# Patient Record
Sex: Female | Born: 1965 | Race: White | Hispanic: No | Smoking: Current every day smoker
Health system: Southern US, Community
[De-identification: ages and names within clinical notes are randomized; demographics above are authoritative.]

## PROBLEM LIST (undated history)

## (undated) DIAGNOSIS — G8929 Other chronic pain: Secondary | ICD-10-CM

## (undated) DIAGNOSIS — M549 Dorsalgia, unspecified: Secondary | ICD-10-CM

## (undated) HISTORY — PX: TONSILLECTOMY: SUR1361

## (undated) HISTORY — PX: CHOLECYSTECTOMY: SHX55

## (undated) HISTORY — PX: BACK SURGERY: SHX140

---

## 2001-09-11 ENCOUNTER — Emergency Department (HOSPITAL_COMMUNITY): Admission: EM | Admit: 2001-09-11 | Discharge: 2001-09-11 | Payer: Self-pay | Admitting: Emergency Medicine

## 2001-10-02 ENCOUNTER — Emergency Department (HOSPITAL_COMMUNITY): Admission: EM | Admit: 2001-10-02 | Discharge: 2001-10-02 | Payer: Self-pay | Admitting: *Deleted

## 2001-10-18 ENCOUNTER — Emergency Department (HOSPITAL_COMMUNITY): Admission: EM | Admit: 2001-10-18 | Discharge: 2001-10-18 | Payer: Self-pay | Admitting: Emergency Medicine

## 2001-10-18 ENCOUNTER — Encounter: Payer: Self-pay | Admitting: *Deleted

## 2001-11-15 ENCOUNTER — Emergency Department (HOSPITAL_COMMUNITY): Admission: EM | Admit: 2001-11-15 | Discharge: 2001-11-15 | Payer: Self-pay | Admitting: *Deleted

## 2001-12-24 ENCOUNTER — Emergency Department (HOSPITAL_COMMUNITY): Admission: EM | Admit: 2001-12-24 | Discharge: 2001-12-24 | Payer: Self-pay | Admitting: Emergency Medicine

## 2001-12-25 ENCOUNTER — Ambulatory Visit (HOSPITAL_COMMUNITY): Admission: RE | Admit: 2001-12-25 | Discharge: 2001-12-25 | Payer: Self-pay | Admitting: Neurosurgery

## 2002-01-10 ENCOUNTER — Ambulatory Visit (HOSPITAL_COMMUNITY): Admission: RE | Admit: 2002-01-10 | Discharge: 2002-01-10 | Payer: Self-pay | Admitting: Neurosurgery

## 2002-01-29 ENCOUNTER — Inpatient Hospital Stay (HOSPITAL_COMMUNITY): Admission: RE | Admit: 2002-01-29 | Discharge: 2002-01-29 | Payer: Self-pay | Admitting: Neurosurgery

## 2002-01-29 ENCOUNTER — Encounter: Payer: Self-pay | Admitting: Neurosurgery

## 2002-04-25 ENCOUNTER — Ambulatory Visit (HOSPITAL_COMMUNITY): Admission: RE | Admit: 2002-04-25 | Discharge: 2002-04-25 | Payer: Self-pay | Admitting: Neurosurgery

## 2002-04-25 ENCOUNTER — Encounter: Payer: Self-pay | Admitting: Neurosurgery

## 2004-05-30 ENCOUNTER — Emergency Department (HOSPITAL_COMMUNITY): Admission: EM | Admit: 2004-05-30 | Discharge: 2004-05-31 | Payer: Self-pay | Admitting: Emergency Medicine

## 2005-08-09 ENCOUNTER — Emergency Department (HOSPITAL_COMMUNITY): Admission: EM | Admit: 2005-08-09 | Discharge: 2005-08-09 | Payer: Self-pay | Admitting: Emergency Medicine

## 2006-03-27 ENCOUNTER — Inpatient Hospital Stay (HOSPITAL_COMMUNITY): Admission: EM | Admit: 2006-03-27 | Discharge: 2006-03-31 | Payer: Self-pay | Admitting: Emergency Medicine

## 2006-07-08 ENCOUNTER — Ambulatory Visit (HOSPITAL_COMMUNITY): Admission: RE | Admit: 2006-07-08 | Discharge: 2006-07-08 | Payer: Self-pay | Admitting: Obstetrics and Gynecology

## 2006-07-11 ENCOUNTER — Emergency Department (HOSPITAL_COMMUNITY): Admission: EM | Admit: 2006-07-11 | Discharge: 2006-07-11 | Payer: Self-pay | Admitting: Emergency Medicine

## 2006-10-20 ENCOUNTER — Inpatient Hospital Stay (HOSPITAL_COMMUNITY): Admission: AD | Admit: 2006-10-20 | Discharge: 2006-10-22 | Payer: Self-pay | Admitting: Obstetrics & Gynecology

## 2006-11-23 ENCOUNTER — Emergency Department (HOSPITAL_COMMUNITY): Admission: EM | Admit: 2006-11-23 | Discharge: 2006-11-23 | Payer: Self-pay | Admitting: Emergency Medicine

## 2007-11-23 ENCOUNTER — Emergency Department (HOSPITAL_COMMUNITY): Admission: EM | Admit: 2007-11-23 | Discharge: 2007-11-23 | Payer: Self-pay | Admitting: Emergency Medicine

## 2009-07-16 ENCOUNTER — Ambulatory Visit: Payer: Self-pay | Admitting: Orthopedic Surgery

## 2009-07-16 ENCOUNTER — Inpatient Hospital Stay (HOSPITAL_COMMUNITY): Admission: EM | Admit: 2009-07-16 | Discharge: 2009-07-20 | Payer: Self-pay | Admitting: Emergency Medicine

## 2009-07-19 ENCOUNTER — Encounter: Payer: Self-pay | Admitting: Orthopedic Surgery

## 2009-07-21 ENCOUNTER — Encounter (HOSPITAL_COMMUNITY): Admission: RE | Admit: 2009-07-21 | Discharge: 2009-08-20 | Payer: Self-pay | Admitting: Internal Medicine

## 2009-08-19 ENCOUNTER — Emergency Department (HOSPITAL_COMMUNITY): Admission: EM | Admit: 2009-08-19 | Discharge: 2009-08-19 | Payer: Self-pay | Admitting: Emergency Medicine

## 2009-09-15 ENCOUNTER — Emergency Department (HOSPITAL_COMMUNITY): Admission: EM | Admit: 2009-09-15 | Discharge: 2009-09-15 | Payer: Self-pay | Admitting: Emergency Medicine

## 2010-03-10 NOTE — Consult Note (Signed)
Summary: Hospital Consult  Consultation Report   Imported By: Cammie Sickle 07/29/2009 07:02:30  _____________________________________________________________________  External Attachment:    Type:   Image     Comment:   External Document

## 2010-04-27 LAB — BASIC METABOLIC PANEL
BUN: 11 mg/dL (ref 6–23)
CO2: 26 mEq/L (ref 19–32)
Chloride: 104 mEq/L (ref 96–112)
Chloride: 106 mEq/L (ref 96–112)
Creatinine, Ser: 0.85 mg/dL (ref 0.4–1.2)
Creatinine, Ser: 1.01 mg/dL (ref 0.4–1.2)
GFR calc Af Amer: >60 mL/min (ref >60)
GFR calc Af Amer: >60 mL/min (ref >60)
GFR calc non Af Amer: >60 mL/min (ref >60)
GFR calc non Af Amer: >60 mL/min (ref >60)
GFR calc non Af Amer: >60 mL/min (ref >60)
Glucose, Bld: 84 mg/dL (ref 70–99)
Glucose, Bld: 92 mg/dL (ref 70–99)
Potassium: 3.2 mEq/L — ABNORMAL LOW (ref 3.5–5.1)
Sodium: 137 mEq/L (ref 135–145)

## 2010-04-27 LAB — DIFFERENTIAL
Basophils Absolute: 0 10*3/uL (ref 0.0–0.1)
Eosinophils Absolute: 0.4 10*3/uL (ref 0.0–0.7)
Eosinophils Relative: 5 % (ref 0–5)
Lymphocytes Relative: 29 % (ref 12–46)
Lymphs Abs: 2.4 10*3/uL (ref 0.7–4.0)
Lymphs Abs: 2.6 10*3/uL (ref 0.7–4.0)
Monocytes Absolute: 0.5 10*3/uL (ref 0.1–1.0)
Monocytes Relative: 6 % (ref 3–12)
Neutro Abs: 4.7 10*3/uL (ref 1.7–7.7)
Neutro Abs: 5.6 10*3/uL (ref 1.7–7.7)
Neutrophils Relative %: 67 % (ref 43–77)

## 2010-04-27 LAB — CBC
HCT: 35.5 % — ABNORMAL LOW (ref 36.0–46.0)
HCT: 41.3 % (ref 36.0–46.0)
Hemoglobin: 11.9 g/dL — ABNORMAL LOW (ref 12.0–15.0)
MCV: 84.1 fL (ref 78.0–100.0)
RBC: 4.23 MIL/uL (ref 3.87–5.11)
RBC: 4.29 MIL/uL (ref 3.87–5.11)
RBC: 4.92 MIL/uL (ref 3.87–5.11)
RDW: 15 % (ref 11.5–15.5)
RDW: 15.4 % (ref 11.5–15.5)
WBC: 8.1 10*3/uL (ref 4.0–10.5)
WBC: 8.8 10*3/uL (ref 4.0–10.5)

## 2010-04-27 LAB — CULTURE, BLOOD (ROUTINE X 2)
Culture: NO GROWTH
Culture: NO GROWTH
Report Status: 6132011

## 2010-04-27 LAB — RAPID URINE DRUG SCREEN, HOSP PERFORMED: Opiates: POSITIVE — AB

## 2010-06-23 NOTE — Op Note (Signed)
Christy Hopkins, Christy Hopkins               ACCOUNT NO.:  [id number]   MEDICAL RECORD NO.:  [id number]          PATIENT TYPE:  INP   LOCATION:  9135                          FACILITY:  [facility]   PHYSICIAN:  Lazaro Arms, M.D.   DATE OF BIRTH:  August 18, 1965   DATE OF PROCEDURE:  10/20/2006  DATE OF DISCHARGE:                               OPERATIVE REPORT   HISTORY:  The patient is a 45 year old gravida 4, para 2-0-1-2,  estimated date of delivery of 11/01/06, currently 58 3/7 weeks' gestation  who presented to the maternal admissions unit complaining of regular  uterine contractions. She was found to be 8 cm and sac with a bulging  bag of water. She was admitted to labor and delivery.   In labor and delivery she received prophylaxis for group B Strep. She  was positive for that. She had experienced spontaneous rupture of  membranes with moderate meconium fluid. She was found to be rim and had  pressure. We began maternal expulsive efforts, got the rim back with 1  push and with 2 more contractions she delivered over an intact perineum  a viable female infant at 0717 hours with Apgars of 8 and 9, weighing 5  pounds and 15 ounces. There was a 3-vessel cord. Cord blood was sent.  The infant was DeLee'd on the perineum before delivery of the shoulders.  There was a loose nuchal cord x1. About 5 mL of meconium stained  amniotic fluid was returned. The infant was then taken to the infant  warm room where it was further suctioned out and is vigorous and is  doing well. The perineum is intact. No lacerations. The placenta was  delivered spontaneously at 0725 hours.   The uterus was firm below the umbilicus and pitocin was given and the  bleeding for delivery was about 200 mL.   The patient tolerated the delivery well. She underwent good routine post  partum care. She is 0 negative and will have RhoGAM studies.      Lazaro Arms, M.D.  Electronically Signed     LHE/MEDQ  D:  10/20/2006  T:   10/20/2006  Job:  [id number]

## 2010-06-26 NOTE — Discharge Summary (Signed)
Christy Hopkins, Christy Hopkins               ACCOUNT NO.:  [id number]   MEDICAL RECORD NO.:  [id number]          PATIENT TYPE:  INP   LOCATION:  A417                          FACILITY:  [facility]   PHYSICIAN:  Tilda Burrow, M.D. DATE OF BIRTH:  30-Apr-1965   DATE OF ADMISSION:  03/27/2006  DATE OF DISCHARGE:  LH                               DISCHARGE SUMMARY   DISCHARGE SUMMARY   ADMISSION DIAGNOSIS:  Cellulitis of the chin and neck, [redacted] weeks pregnant.  History of 4 teeth being extracted 3 weeks ago because of infection.   DISCHARGE DIAGNOSIS:  2/20  Abscess of chin with abscess extension into neck.  Eight weeks pregnant.  Status post dental extraction 3 weeks ago.   MRSA infected chin abscess, community acquired   HOSPITAL SUMMARY:  This 45 year old white female was admitted as  described in Dr. Forestine Chute note on March 27, 2006 for cellulitis that  began approximately 4 days ago.  She was admitted for antibiotic therapy  and covered for oral bacteria due to the history of recent dental  surgery with Unasyn and Cleocin.  The patient remained afebrile but over  the last two days the patient initially responded with gradual  decreasing of the edema of the neck, but over the course of the last day  has had hardening and thickening of the submandibular area on the right  and the chin temple has coalesced to the point that it seems to  represent an abscess.  Incision and drainage of the chin abscess tonight  was performed, removing an inspissated plug of sebaceous material and  underlying purulence, but unfortunately massage of the hard tissue of  the neck causes purulent material to be expressed from the chin abscess  opening, indicating communications of the abscess deep beneath the skin  of the chin into the submandibular area.  The case has been therefore  discussed with Dr. Newman Pies of [facility] and patient  has had arrangements made for transfer to his care early in the  morning.  Antibiotic selection regimen and patient status is reviewed and early  morning transfer is considered reasonable.  The patient accepts transfer  and will be sent to [facility] first thing in the a.m.   Addendum:  Due to lack of a bed in [facility], patient was not  transferred, Consult obtained with Dr Lovell Sheehan.  Repeat CT of the neck  reveals no visible abscess in the cellulitis of the neck. clinical  inprovement occurred on iv Gentamycin and iv Flagyl.   Patient discharged home on Dicloxacillin, and Flagyl based on cultures  available at d/c on the evening of 2/20  Cultures have now returned + for MRSA, and efforts have been made to  contact patient via listed contacts, but phne d/c'd and  next of kin  doesn't know how to contact patient.  jvf 2/25      Tilda Burrow, M.D.  Electronically Signed     JVF/MEDQ  D:  03/29/2006  T:  03/30/2006  Job:  [id number]

## 2010-06-26 NOTE — Discharge Summary (Signed)
[name], Christy Hopkins               ACCOUNT NO.:  [id number]   MEDICAL RECORD NO.:  [id number]          PATIENT TYPE:  INP   LOCATION:  A417                          FACILITY:  [facility]   PHYSICIAN:  Tilda Burrow, M.D. DATE OF BIRTH:  [birth year]   DATE OF ADMISSION:  DATE OF DISCHARGE:  LH                               DISCHARGE SUMMARY   ADDENDUM:   ADMISSION DIAGNOSES:  1. Cellulitis of chin and neck.  Secondary to MRSA  patient contacted      with change in antibiotics to Septra.  2. Eight weeks pregnant.  3. History of dental extraction.   This 45 year old female with chin abscess and neck cellulitis associated  with it was admitted March 27, 2006.  See HPI by Dr. Despina Hidden for  details.  After incision and drainage of the chin abscess performed by  Jannifer Franklin on the March 29, 2006.  We arranged probable transfer  the next morning.  Due to absence of a bed, she was unable to be  transferred and consult was obtained by general surgery.  A CT of the  neck was repeated which showed the well-defined 4 x 3 cm clearly  demarcated indurated area in the neck.  Did not include any identifiable  abscess on CT of the neck without contrast.  Antibiotics were changed  and she was put on gentamycin.  We had stopped the earlier medicine,  Unasyn, due to some itching.  Gentamycin was continued for two days.  She gradually got better.  The chin showed decrease in size after the  incision and drainage.  She was discharged on March 31, 2006 late in  the day.  She had been seen  by Dr. Despina Hidden early in the day but due to the  increment weather we made the discharge later in the evening of  the  21st for follow up next week.   Prescriptions called to [facility]:  1. Dicloxacillin 500 mg b.i.d. x7 days.  2. Flagyl 500 mg b.i.d. x7 days.   Follow up next week in office.  Also follow up in [facility]  Ear/Nose/Throat.      Tilda Burrow, M.D.  Electronically Signed     JVF/MEDQ  D:  03/31/2006  T:  04/01/2006  Job:  [id number]

## 2010-06-26 NOTE — Op Note (Signed)
NAME:  Christy Hopkins, Christy Hopkins                         ACCOUNT NO.:  [id number]   MEDICAL RECORD NO.:  [id number]                   PATIENT TYPE:  INP   LOCATION:  3012                                 FACILITY:  [facility]   PHYSICIAN:  Kathaleen Maser. Pool, M.D.                 DATE OF BIRTH:  1965-12-08   DATE OF PROCEDURE:  01/29/2002  DATE OF DISCHARGE:                                 OPERATIVE REPORT   PREOPERATIVE DIAGNOSIS:  Left L5-S1 herniated nucleus pulposus with  radiculopathy.   POSTOPERATIVE DIAGNOSIS:  Left L5-S1 herniated nucleus pulposus with  radiculopathy.   PROCEDURE:  Left L5-S1 laminotomy with microdiskectomy.   SURGEON:  Kathaleen Maser. Pool, M.D.   ASSISTANT:  Izell [place]. Elesa Hacker, M.D.   ANESTHESIA:  General endotracheal anesthesia.   INDICATIONS:  The patient is a 45 year old female with a history of back and  left lower extremity pain and weakness with a left-sided S1 radiculopathy  which has failed conservative management. An MRI scan demonstrates a very  large left-sided L5-S1 disk herniation with marked compression of the thecal  sac and a left-sided nerve root. We discussed the options of her management,  including the possibility  of undergoing a left-sided L5-S1 laminotomy and  microdiskectomy with hope of improvement of her symptoms. The patient  weighed the risks and benefits and wishes to proceed.   DESCRIPTION OF PROCEDURE:  The patient was brought to the operating room and  placed in the supine position on the operating table. After an adequate  level of anesthesia was achieved, the patient was placed prone on the Wilson  frame and appropriately padded.  The patient's lumbar region was prepped and  draped sterilely.   A knife blade was used to make a linear skin incision overlying the L5-S1  interspace. This was carried down sharply in the midline and a subperiosteal  dissection was performed, exposing the lamina and facet joints at L5 and S1.  A deep  self-retaining retractor was placed. An intraoperative x-ray was  taken and the level was confirmed.   The laminotomy was then performed using the high speed drill with the  Kerrison rongeurs to remove the inferior one-third of the lamina of L5, the  medial edge of the L5-S1 facet joint and the superior rim of the S1. The  ligamentum flavum was then elevated and dissected in a piecemeal fashion  using the Kerrison rongeurs and carried down to thecal sac and the exiting  S1 nerve root was identified.   The microscope was brought into the field and used for microdissection of  the left-sided S1 nerve root and underlying disk herniation.  The epidural  venous plexus was coagulated and cut. The thecal sac and S1 nerve root were  mobilized and gradually retracted towards the midline. The disk herniation  was readily apparent. This was then incised with a #15 blade in a  rectangular fashion.  A wide disk space clean-out was then achieved using  pituitary rongeurs and Epstein curette.  All loose bodies and degenerative  disk material was removed from the interspace.  All elements of  the disk  herniation were completely  resected. There was no evidence of any  residual  fragments. There was no evidence of any residual compression of the thecal  sac or nerve roots. There was no evidence of any injury to the thecal sac or  nerve roots.   The wound was then irrigated with antibiotic solution. Gelfoam was placed  topically for hemostasis and found to be good. The retractor system and  microscope were removed. Hemostasis of the muscle was achieved with  electrocautery. The wound was closed in layers with Vicryl sutures. Steri-  Strips and a sterile dressing were applied. There were no intraoperative  complications. The patient tolerated the procedure well and she returned to  the recovery room postoperatively.                                               Henry A. Pool, M.D.    HAP/MEDQ  D:   01/29/2002  T:  01/29/2002  Job:  [id number]

## 2010-06-26 NOTE — H&P (Signed)
Christy Hopkins, Christy Hopkins               ACCOUNT NO.:  [id number]   MEDICAL RECORD NO.:  [id number]          PATIENT TYPE:  INP   LOCATION:  A417                          FACILITY:  [facility]   PHYSICIAN:  Lazaro Arms, M.D.   DATE OF BIRTH:  1965-03-13   DATE OF ADMISSION:  03/27/2006  DATE OF DISCHARGE:  LH                              HISTORY & PHYSICAL   HISTORY:  Christy Hopkins is a 45 year old white female, gravid 4, para 2,  abortus 1; last menstrual period of January 27, 2006.  She presented to  the emergency room this evening complaining of chin and neck pain, which  basically began yesterday.  She states that she had some redness and  tenderness of her chin yesterday, but this morning when she woke up the  redness and pain had extended all the way down her neck.  She states  that approximately 3 weeks ago she had 4 teeth removed on the lower  right. The best I could tell they were the back 4 teeth; because of an  abscess.  She had been on antibiotics for that, but that seemed to her  at least to have cleared up.  She states she also had a small bump on  her chin, almost what you could call acne pimple; but it really seemed  to be nothing more than that.  She has had no fever.  Had a little bit  of nausea last night, but has had no nausea or vomiting with the  pregnancy.  Her last menstrual period was January 27, 2006.  She has  had no spotting or bleeding throughout.  She has had a confirmed  positive pregnancy test, but otherwise has not had any followup.  She is  taking prenatal vitamins.  She has an appointment to be seen in our  office next week.   PAST MEDICAL HISTORY:  Negative.   PAST SURGICAL HISTORY:  1. Lumbar laminectomy.  2. Cholecystectomy.   PAST OB HISTORY:  She has had 2 vaginal deliveries and 1 pregnancy loss.   ALLERGIES:  NONE.   MEDICATIONS:  Prenatal vitamins.   REVIEW OF SYMPTOMS:  Otherwise negative.   SOCIAL HISTORY:  She is unemployed and smokes 2 packs  of cigarettes per  day.  She has not done any alcohol or illicit drugs, according to  history.   PHYSICAL EXAMINATION:  HEENT:  She has an obvious erythema of her chin,  a little bit to the right of midline; and some erythema extending down  her chin, down her neck, really to the point of her clavicles.  On oral  examination, visually she has no evidence of any abscess or anything  going on in her mouth.  On palpation her gum on that side where she had  the teeth extracted is not tender.  She does not have any tenderness on  the inside, until you palpate sort of toward the skin and the area where  it is obviously red on the outside of her chin.  This does not appear,  at least to me, to be an extension  of an abscess or any active process  going on like an osteomyelitis, or anything of that nature.  I guess at  this point I really cannot be sure just on examination.  HEART:  Regular rhythm.  No murmurs, rubs or gallops.  LUNGS:  Clear.  BREASTS:  Deferred.  ABDOMEN:  Benign.  PELVIC:  Deferred.  EXTREMITIES:  Warm with no edema.   LABORATORY DATA:  The patient had labs drawn, revealing:  White count  14,000 with a significant left shift.  She also had a CT scan of her  neck, which shows soft tissue changes consistent with a cellulitis of  the chin; extending all the way down to the upper chest.  There is no  phlegmon or abscess present, and there is no mention, at least, of  osteomyelitis; but, I will certainly have them re-evaluate that again,  should this not clear up.  Again, it does not appear to be an extension  from bone or specifically an oral abscess that is now extended down into  this compartment.  I certainly would be suspicious that the bacteria  involved could be oral.   IMPRESSION:  1. Cellulitis of chin and neck.  2. Eight weeks pregnant.  3. History of 4 teeth being extracted 3 weeks ago because of an      infection.   PLAN:  Because it could possibly be an oral  bacteria, I am going to  place the patient on Unasyn and Cleocin -- which should cover all the  specific bacteria.  I do not suspect MRSA at this point; she has no  history of that and it does not look like that.  But this is certainly  something to keep in mind.  If this does not seem to clear up, then we  will need to evaluate more closely if this is related to a bony  extension  (such as an osteomyelitis); but the CT scan did not show  that.  She understands she will be here probably 72 hours at least.  We  will get serial white counts done.      Lazaro Arms, M.D.  Electronically Signed     LHE/MEDQ  D:  03/27/2006  T:  03/27/2006  Job:  [id number]

## 2010-11-09 LAB — STREP A DNA PROBE: Group A Strep Probe: NEGATIVE

## 2010-11-20 LAB — RH IMMUNE GLOB WKUP(>/=20WKS)(NOT WOMEN'S HOSP): Fetal Screen: NEGATIVE

## 2010-11-20 LAB — CBC
HCT: 32.4 — ABNORMAL LOW
HCT: 37
Hemoglobin: 13
MCHC: 35.1
MCHC: 35.2
MCV: 85.7
MCV: 87
Platelets: 256
Platelets: 298
RBC: 3.72 — ABNORMAL LOW
RBC: 4.32
RDW: 15.1 — ABNORMAL HIGH
RDW: 15.5 — ABNORMAL HIGH
WBC: 13.7 — ABNORMAL HIGH
WBC: 15.3 — ABNORMAL HIGH

## 2010-11-20 LAB — GLUCOSE, RANDOM: Glucose, Bld: 84

## 2010-11-20 LAB — RPR: RPR Ser Ql: NONREACTIVE

## 2010-12-24 ENCOUNTER — Encounter: Payer: Self-pay | Admitting: *Deleted

## 2010-12-24 ENCOUNTER — Emergency Department (HOSPITAL_COMMUNITY)
Admission: EM | Admit: 2010-12-24 | Discharge: 2010-12-24 | Disposition: A | Discharge status: Home / Self Care | Payer: Medicaid Other | Attending: Emergency Medicine | Admitting: Emergency Medicine

## 2010-12-24 ENCOUNTER — Emergency Department (HOSPITAL_COMMUNITY): Payer: Medicaid Other

## 2010-12-24 DIAGNOSIS — M545 Low back pain, unspecified: Secondary | ICD-10-CM | POA: Insufficient documentation

## 2010-12-24 DIAGNOSIS — M5431 Sciatica, right side: Secondary | ICD-10-CM

## 2010-12-24 DIAGNOSIS — F17200 Nicotine dependence, unspecified, uncomplicated: Secondary | ICD-10-CM | POA: Insufficient documentation

## 2010-12-24 DIAGNOSIS — M5430 Sciatica, unspecified side: Secondary | ICD-10-CM | POA: Insufficient documentation

## 2010-12-24 LAB — POCT PREGNANCY, URINE: Preg Test, Ur: NEGATIVE

## 2010-12-24 MED ORDER — OXYCODONE-ACETAMINOPHEN 5-325 MG PO TABS: 2.0000 | ORAL_TABLET | ORAL | Status: AC | PRN

## 2010-12-24 MED ORDER — CYCLOBENZAPRINE HCL 5 MG PO TABS: 5.0000 mg | ORAL_TABLET | Freq: Three times a day (TID) | ORAL | Status: AC | PRN

## 2010-12-24 MED ORDER — IBUPROFEN 600 MG PO TABS: 600.0000 mg | ORAL_TABLET | Freq: Four times a day (QID) | ORAL | Status: AC | PRN

## 2010-12-24 MED ORDER — OXYCODONE-ACETAMINOPHEN 5-325 MG PO TABS
2.0000 | ORAL_TABLET | Freq: Once | ORAL | Status: AC
  Administered 2010-12-24: 2 via ORAL
  Filled 2010-12-24: qty 2

## 2010-12-24 NOTE — ED Notes (Signed)
Pt c/o lower back pain radiating down to her buttock and right leg since Tuesday. Denies injury.

## 2010-12-24 NOTE — ED Provider Notes (Signed)
History   This chart was scribed for Christy Chick, MD by Clarita Crane. The patient was seen in room APFT24/APFT24 and the patient's care was started at 11:04AM.   CSN: [id number] Arrival date & time: 12/24/2010 10:47 AM   First MD Initiated Contact with Patient 12/24/10 1039      Chief Complaint  Patient presents with  . Back Pain     HPI Christy Hopkins is a 45 y.o. female who presents to the Emergency Department complaining of constant moderate right sided lower back pain which radiates to RLE onset 3 days ago while mopping and worsening since with no associated symptoms. Denies weakness, numbness, tingling, incontinence, fever, urinary retention, recent falls/trauma. States back pain is aggravated by movement and relieved by nothing. Reports having a h/o back surgery performed by a neurosurgeon in [place]. Pain worse with movement and palpation.  Has tried tylenol, aspirin, ibuprofen without improvement in pain.   History reviewed. No pertinent past medical history.  Past Surgical History  Procedure Date  . Back surgery   . Cholecystectomy   . Tonsillectomy     History reviewed. No pertinent family history.  History  Substance Use Topics  . Smoking status: Current Everyday Smoker -- 1.0 packs/day    Types: Cigarettes  . Smokeless tobacco: Not on file  . Alcohol Use: No    OB History    Grav Para Term Preterm Abortions TAB SAB Ect Mult Living                  Review of Systems 10 Systems reviewed and are negative for acute change except as noted in the HPI.  Allergies  Review of patient's allergies indicates no known allergies.  Home Medications  No current outpatient prescriptions on file.  BP 111/76  Pulse 75  Temp(Src) 97.8 F (36.6 C) (Oral)  Resp 18  Ht 5\' 2"  (1.575 m)  Wt 165 lb (74.844 kg)  BMI 30.18 kg/m2  SpO2 98%  LMP 11/09/2010  Physical Exam  Nursing note and vitals reviewed. Constitutional: She is oriented to person, place, and  time. She appears well-developed and well-nourished. No distress.  HENT:  Head: Normocephalic and atraumatic.  Eyes: EOM are normal.  Neck: Neck supple. No tracheal deviation present.  Cardiovascular: Normal rate and regular rhythm.   No murmur heard. Pulmonary/Chest: Effort normal. No respiratory distress. She has no wheezes.  Abdominal: She exhibits no distension.  Musculoskeletal: Normal range of motion. She exhibits no edema.       Entire midline spine non-tender. Tender to palpation to right para-lumbar region.   Neurological: She is alert and oriented to person, place, and time. No sensory deficit.       5/5 strength of bilateral lower extremities. Distal sensation intact.   Skin: Skin is warm and dry.  Psychiatric: She has a normal mood and affect. Her behavior is normal.    ED Course  Procedures (including critical care time)  DIAGNOSTIC STUDIES: Oxygen Saturation is 98% on room air, normal by my interpretation.       Labs Reviewed  POCT PREGNANCY, URINE  POCT PREGNANCY, URINE   Dg Lumbar Spine Complete  12/24/2010  *RADIOLOGY REPORT*  Clinical Data: Low back pain extending down right leg.  LUMBAR SPINE - COMPLETE 4+ VIEW  Comparison: 09/15/2009  Findings: Mild progression of degenerative disc disease is seen which is mild at L4-5 and severe at L5-S1.  Moderate to severe facet DJD is also seen at L5-S1.  Mild  degenerative anterolisthesis at L4-5 measuring approximately 3 mm remains stable.  There is no evidence of acute fracture or spondylolysis.  IMPRESSION:  Mild progression of lower lumbar spondylosis, as described above.  Original Report Authenticated By: Danae Orleans, M.D.     No diagnosis found.    MDM  Pt with hx of low back pain s/p lumbar fusion approx 5 years ago.  Now with right sided low back pain and radiation into right leg beginning after mopping 3 days ago.  No specific trauma or injury.  xrays with no acute abnormalities.  No signs or symptoms of  cauda equina.  Pt discharged with medications for pain, inflamation, muscle relaxation.  Discharged with strict return precautions.  Pt agreeable with plan.      I personally performed the services described in this documentation, which was scribed in my presence. The recorded information has been reviewed and considered.     Christy Chick, MD 12/24/10 1256

## 2010-12-24 NOTE — ED Notes (Signed)
Patient with c/o lower back pain since Tuesday, pain radiates down right leg per pt, denies any injury, hx back surgery per pt

## 2010-12-24 NOTE — ED Notes (Signed)
Patient in bathroom for urine sample for pregnancy test

## 2011-04-08 ENCOUNTER — Emergency Department (HOSPITAL_COMMUNITY)
Admission: EM | Admit: 2011-04-08 | Discharge: 2011-04-08 | Disposition: A | Discharge status: Home / Self Care | Payer: Medicaid Other | Attending: Emergency Medicine | Admitting: Emergency Medicine

## 2011-04-08 ENCOUNTER — Encounter (HOSPITAL_COMMUNITY): Payer: Self-pay

## 2011-04-08 DIAGNOSIS — M549 Dorsalgia, unspecified: Secondary | ICD-10-CM | POA: Insufficient documentation

## 2011-04-08 MED ORDER — HYDROCODONE-ACETAMINOPHEN 7.5-325 MG PO TABS: 1.0000 | ORAL_TABLET | ORAL | Status: AC | PRN

## 2011-04-08 MED ORDER — DEXAMETHASONE 4 MG PO TABS: ORAL_TABLET | ORAL | Status: AC

## 2011-04-08 MED ORDER — HYDROCODONE-ACETAMINOPHEN 5-325 MG PO TABS
2.0000 | ORAL_TABLET | Freq: Once | ORAL | Status: AC
  Administered 2011-04-08: 2 via ORAL
  Filled 2011-04-08: qty 2

## 2011-04-08 MED ORDER — CYCLOBENZAPRINE HCL 10 MG PO TABS: 10.0000 mg | ORAL_TABLET | Freq: Three times a day (TID) | ORAL | Status: AC

## 2011-04-08 MED ORDER — ONDANSETRON HCL 4 MG PO TABS
4.0000 mg | ORAL_TABLET | Freq: Once | ORAL | Status: AC
  Administered 2011-04-08: 4 mg via ORAL
  Filled 2011-04-08: qty 1

## 2011-04-08 MED ORDER — DIAZEPAM 5 MG PO TABS
5.0000 mg | ORAL_TABLET | Freq: Once | ORAL | Status: AC
  Administered 2011-04-08: 5 mg via ORAL
  Filled 2011-04-08: qty 1

## 2011-04-08 NOTE — ED Provider Notes (Signed)
History     CSN: [id number]  Arrival date & time 04/08/11  1507   First MD Initiated Contact with Patient 04/08/11 1738      Chief Complaint  Patient presents with  . Back Pain    (Consider location/radiation/quality/duration/timing/severity/associated sxs/prior treatment) Patient is a 46 y.o. female presenting with back pain. The history is provided by the patient.  Back Pain  This is a new problem. The current episode started yesterday. The problem occurs daily. The problem has not changed since onset.The pain is associated with falling. The pain is present in the lumbar spine. The quality of the pain is described as aching. The pain is severe. The symptoms are aggravated by bending, twisting and certain positions. The pain is the same all the time. Pertinent negatives include no chest pain, no abdominal pain, no bowel incontinence, no bladder incontinence, no dysuria, no paresthesias and no paresis. She has tried analgesics for the symptoms.    History reviewed. No pertinent past medical history.  Past Surgical History  Procedure Date  . Back surgery   . Cholecystectomy   . Tonsillectomy     History reviewed. No pertinent family history.  History  Substance Use Topics  . Smoking status: Current Everyday Smoker -- 1.0 packs/day    Types: Cigarettes  . Smokeless tobacco: Not on file  . Alcohol Use: No    OB History    Grav Para Term Preterm Abortions TAB SAB Ect Mult Living                  Review of Systems  Constitutional: Negative for activity change.       All ROS Neg except as noted in HPI  HENT: Negative for nosebleeds and neck pain.   Eyes: Negative for photophobia and discharge.  Respiratory: Negative for cough, shortness of breath and wheezing.   Cardiovascular: Negative for chest pain and palpitations.  Gastrointestinal: Negative for abdominal pain, blood in stool and bowel incontinence.  Genitourinary: Negative for bladder incontinence, dysuria,  frequency and hematuria.  Musculoskeletal: Positive for back pain. Negative for arthralgias.  Skin: Negative.   Neurological: Negative for dizziness, seizures, speech difficulty and paresthesias.  Psychiatric/Behavioral: Negative for hallucinations and confusion.    Allergies  Review of patient's allergies indicates no known allergies.  Home Medications   Current Outpatient Rx  Name Route Sig Dispense Refill  . ACETAMINOPHEN 325 MG PO TABS Oral Take 650 mg by mouth as needed. For pain      BP 118/78  Pulse 75  Temp(Src) 97.3 F (36.3 C) (Oral)  Resp 18  Ht 5\' 3"  (1.6 m)  Wt 170 lb (77.111 kg)  BMI 30.11 kg/m2  SpO2 98%  LMP 04/08/2011  Physical Exam  Nursing note and vitals reviewed. Constitutional: She is oriented to person, place, and time. She appears well-developed and well-nourished.  Non-toxic appearance.  HENT:  Head: Normocephalic.  Right Ear: Tympanic membrane and external ear normal.  Left Ear: Tympanic membrane and external ear normal.  Eyes: EOM and lids are normal. Pupils are equal, round, and reactive to light.  Neck: Normal range of motion. Neck supple. Carotid bruit is not present.  Cardiovascular: Normal rate, regular rhythm, normal heart sounds, intact distal pulses and normal pulses.   Pulmonary/Chest: Breath sounds normal. No respiratory distress.  Abdominal: Soft. Bowel sounds are normal. There is no tenderness. There is no guarding.  Musculoskeletal: Normal range of motion.       Tenderness over the lumbar area. Pain  with ROM.  Lymphadenopathy:       Head (right side): No submandibular adenopathy present.       Head (left side): No submandibular adenopathy present.    She has no cervical adenopathy.  Neurological: She is alert and oriented to person, place, and time. She has normal strength. No cranial nerve deficit or sensory deficit. She exhibits normal muscle tone. Coordination normal.       Gait wnl.  Skin: Skin is warm and dry.  Psychiatric:  She has a normal mood and affect. Her speech is normal.    ED Course  Procedures (including critical care time)  Labs Reviewed - No data to display No results found.   Dx: Lumbar strain   MDM  I have reviewed nursing notes, vital signs, and all appropriate lab and imaging results for this patient. The patient sustained a fall down approximately 3 steps on February 27. She sustained injury to her back. It is of note that the patient has had previous back surgery in the same area. The plan for now is for the patient to receive a prescription for dexamethasone for 6 days. Prescription for Flexeril 3 times daily, and Norco 5 mg.       Kathie Dike, [place] 04/08/11 [phone]

## 2011-04-08 NOTE — ED Notes (Signed)
Hx of back problems. Fell on steps yesterday. Complain of low back pain

## 2011-04-08 NOTE — Discharge Instructions (Signed)
Please apply heat to your back. Dexamethasone 4 mg, 1 twice a day with food. Flexeril 10 mg 3 times a day for spasm. Norco 7.5 mg every 4 hours as needed for pain. Flexeril and Norco may cause drowsiness, please use with caution. Please see the spine specialty clinic listed above if not improving.

## 2011-04-08 NOTE — ED Notes (Signed)
Alert, NAD.  Pain low back ,slipped and fell yesterday on wet step.  "sat down hard".

## 2011-04-09 NOTE — ED Provider Notes (Signed)
Medical screening examination/treatment/procedure(s) were performed by non-physician practitioner and as supervising physician I was immediately available for consultation/collaboration.  Nicoletta Dress. Colon Branch, MD 04/09/11 1731

## 2012-02-21 ENCOUNTER — Encounter (HOSPITAL_COMMUNITY): Payer: Self-pay | Admitting: *Deleted

## 2012-02-21 ENCOUNTER — Emergency Department (HOSPITAL_COMMUNITY)
Admission: EM | Admit: 2012-02-21 | Discharge: 2012-02-21 | Disposition: A | Discharge status: Home / Self Care | Payer: Medicaid Other | Attending: Emergency Medicine | Admitting: Emergency Medicine

## 2012-02-21 DIAGNOSIS — IMO0002 Reserved for concepts with insufficient information to code with codable children: Secondary | ICD-10-CM | POA: Insufficient documentation

## 2012-02-21 DIAGNOSIS — F17200 Nicotine dependence, unspecified, uncomplicated: Secondary | ICD-10-CM | POA: Insufficient documentation

## 2012-02-21 DIAGNOSIS — M79609 Pain in unspecified limb: Secondary | ICD-10-CM | POA: Insufficient documentation

## 2012-02-21 DIAGNOSIS — M5416 Radiculopathy, lumbar region: Secondary | ICD-10-CM

## 2012-02-21 MED ORDER — CYCLOBENZAPRINE HCL 10 MG PO TABS: 10.0000 mg | ORAL_TABLET | Freq: Three times a day (TID) | ORAL | Status: DC | PRN

## 2012-02-21 MED ORDER — PREDNISONE 10 MG PO TABS: ORAL_TABLET | ORAL | Status: DC

## 2012-02-21 MED ORDER — OXYCODONE-ACETAMINOPHEN 5-325 MG PO TABS
1.0000 | ORAL_TABLET | Freq: Once | ORAL | Status: AC
  Administered 2012-02-21: 1 via ORAL
  Filled 2012-02-21: qty 1

## 2012-02-21 MED ORDER — HYDROCODONE-ACETAMINOPHEN 7.5-325 MG PO TABS: 1.0000 | ORAL_TABLET | Freq: Four times a day (QID) | ORAL | Status: DC | PRN

## 2012-02-21 MED ORDER — CYCLOBENZAPRINE HCL 10 MG PO TABS
10.0000 mg | ORAL_TABLET | Freq: Once | ORAL | Status: AC
Start: 2012-02-21 — End: 2012-02-21
  Administered 2012-02-21: 10 mg via ORAL
  Filled 2012-02-21: qty 1

## 2012-02-21 NOTE — ED Provider Notes (Signed)
History     CSN: [id number]  Arrival date & time 02/21/12  1739   First MD Initiated Contact with Patient 02/21/12 1818      Chief Complaint  Patient presents with  . Back Pain    (Consider location/radiation/quality/duration/timing/severity/associated sxs/prior treatment) HPI Comments: Patient complains of low back pain that radiates into her right leg down to her knee. Symptoms have been present for several days. Pain is worse with certain movements and improves somewhat with rest. She states that she had similar symptoms 6 years ago before having a discectomy of her lower back. She denies dysuria, abdominal pain, extremity numbness or weakness, saddle anesthesias, or incontinence  Patient is a 47 y.o. female presenting with back pain. The history is provided by the patient.  Back Pain  This is a new problem. The current episode started more than 2 days ago. The problem occurs constantly. The problem has not changed since onset.The pain is associated with no known injury. The pain is present in the sacro-iliac joint and lumbar spine. The quality of the pain is described as aching, shooting and burning. The pain radiates to the right thigh and right knee. The pain is moderate. The symptoms are aggravated by bending, twisting and certain positions. Associated symptoms include leg pain. Pertinent negatives include no chest pain, no fever, no numbness, no abdominal pain, no abdominal swelling, no bowel incontinence, no perianal numbness, no bladder incontinence, no dysuria, no pelvic pain, no paresthesias, no paresis, no tingling and no weakness. Treatments tried: Tylenol. The treatment provided no relief.    History reviewed. No pertinent past medical history.  Past Surgical History  Procedure Date  . Back surgery   . Cholecystectomy   . Tonsillectomy     No family history on file.  History  Substance Use Topics  . Smoking status: Current Every Day Smoker -- 1.0 packs/day    Types:  Cigarettes  . Smokeless tobacco: Not on file  . Alcohol Use: No    OB History    Grav Para Term Preterm Abortions TAB SAB Ect Mult Living                  Review of Systems  Constitutional: Negative for fever.  Respiratory: Negative for shortness of breath.   Cardiovascular: Negative for chest pain.  Gastrointestinal: Negative for vomiting, abdominal pain, constipation and bowel incontinence.  Genitourinary: Negative for bladder incontinence, dysuria, hematuria, flank pain, decreased urine volume, difficulty urinating and pelvic pain.       No perineal numbness or incontinence of urine or feces  Musculoskeletal: Positive for back pain. Negative for joint swelling.  Skin: Negative for rash.  Neurological: Negative for tingling, weakness, numbness and paresthesias.  All other systems reviewed and are negative.    Allergies  Review of patient's allergies indicates no known allergies.  Home Medications   Current Outpatient Rx  Name  Route  Sig  Dispense  Refill  . ACETAMINOPHEN 325 MG PO TABS   Oral   Take 650 mg by mouth as needed. For pain           BP 134/88  Pulse 85  Temp 98.4 F (36.9 C) (Oral)  Resp 16  Ht 5\' 2"  (1.575 m)  Wt 160 lb (72.576 kg)  BMI 29.26 kg/m2  SpO2 99%  LMP 02/14/2012  Physical Exam  Nursing note and vitals reviewed. Constitutional: She is oriented to person, place, and time. She appears well-developed and well-nourished. No distress.  HENT:  Head: Normocephalic and atraumatic.  Neck: Normal range of motion. Neck supple.  Cardiovascular: Normal rate, regular rhythm and intact distal pulses.   No murmur heard. Pulmonary/Chest: Effort normal and breath sounds normal.  Abdominal: Soft. She exhibits no distension. There is no tenderness.  Musculoskeletal: She exhibits tenderness. She exhibits no edema.       Lumbar back: She exhibits tenderness and pain. She exhibits normal range of motion, no swelling, no deformity, no laceration and  normal pulse.       Back:       Tenderness to palpation of the right lumbar paraspinal muscles and right SI joint space. Pain is reproduced with straight-leg raise on the right. DP pulses are equal and brisk bilaterally distal sensation intact. No calf pain or edema.  Neurological: She is alert and oriented to person, place, and time. No sensory deficit. She exhibits normal muscle tone. Coordination and gait normal.  Reflex Scores:      Patellar reflexes are 2+ on the right side and 2+ on the left side.      Achilles reflexes are 2+ on the right side and 2+ on the left side. Skin: Skin is warm and dry.    ED Course  Procedures (including critical care time)  Labs Reviewed - No data to display No results found.      MDM    Patient has ttp of the right lumbar paraspinal muscles and SI joint.  No focal neuro deficits on exam.  Ambulates with a steady gait.     Patient reviewed on the [facility] narcotics database with no recent narcotic prescriptions found  Pain is likely related to lumbar radiculopathy. Doubt emergent neurological or infectious process.  patient agrees to followup with her primary care physician   Prescribed Norco #20 Flexeril Prednisone taper  Ichiro Chesnut L. [place], [place] 02/21/12 1851

## 2012-02-21 NOTE — ED Notes (Signed)
Pt with lower back pain that radiates down right leg, denies any injury to back, hx of back surgery about 6 years ago per pt, denies any incont. Of bladder or bowels since back pain

## 2012-02-21 NOTE — ED Notes (Signed)
Lower back pain with pain radiating down right leg.

## 2012-03-04 NOTE — ED Provider Notes (Signed)
History/physical exam/procedure(s) were performed by non-physician practitioner and as supervising physician I was immediately available for consultation/collaboration. I have reviewed all notes and am in agreement with care and plan.   Hilario Quarry, MD 03/04/12 [phone]

## 2012-07-24 ENCOUNTER — Emergency Department (HOSPITAL_COMMUNITY)
Admission: EM | Admit: 2012-07-24 | Discharge: 2012-07-24 | Disposition: A | Discharge status: Home / Self Care | Payer: Medicaid Other | Attending: Emergency Medicine | Admitting: Emergency Medicine

## 2012-07-24 ENCOUNTER — Encounter (HOSPITAL_COMMUNITY): Payer: Self-pay | Admitting: *Deleted

## 2012-07-24 DIAGNOSIS — F17200 Nicotine dependence, unspecified, uncomplicated: Secondary | ICD-10-CM | POA: Insufficient documentation

## 2012-07-24 DIAGNOSIS — Z9889 Other specified postprocedural states: Secondary | ICD-10-CM | POA: Insufficient documentation

## 2012-07-24 DIAGNOSIS — M5430 Sciatica, unspecified side: Secondary | ICD-10-CM | POA: Insufficient documentation

## 2012-07-24 DIAGNOSIS — G8929 Other chronic pain: Secondary | ICD-10-CM | POA: Insufficient documentation

## 2012-07-24 DIAGNOSIS — M5431 Sciatica, right side: Secondary | ICD-10-CM

## 2012-07-24 MED ORDER — CYCLOBENZAPRINE HCL 10 MG PO TABS: 10.0000 mg | ORAL_TABLET | Freq: Two times a day (BID) | ORAL | Status: DC | PRN

## 2012-07-24 MED ORDER — HYDROCODONE-ACETAMINOPHEN 5-325 MG PO TABS: 1.0000 | ORAL_TABLET | ORAL | Status: DC | PRN

## 2012-07-24 NOTE — ED Notes (Signed)
Chronic back pain but states pain radiates down right leg instead of left leg

## 2012-07-24 NOTE — ED Provider Notes (Signed)
History     CSN: [id number]  Arrival date & time 07/24/12  1425   None     Chief Complaint  Patient presents with  . Back Pain    (Consider location/radiation/quality/duration/timing/severity/associated sxs/prior treatment) Patient is a 47 y.o. female presenting with back pain. The history is provided by the patient.  Back Pain Location:  Lumbar spine Quality:  Aching and shooting Radiates to:  R posterior upper leg Pain severity:  Moderate Pain is:  Same all the time Onset quality:  Gradual Duration:  5 days Timing:  Constant Chronicity:  Chronic Relieved by:  Being still Worsened by:  Ambulation and movement Ineffective treatments:  Cold packs and heating pad Associated symptoms: leg pain   Associated symptoms: no abdominal pain, no bladder incontinence, no bowel incontinence, no dysuria, no fever, no headaches and no weight loss    Christy Hopkins is a 47 y.o. female who presents to the ED with low back pain that radiates to her right leg. The pain started 5 days ago. 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 She also goes to school. She has chronic back pain that she usually uses heat, ice and ibuprofen and keeps going. This time the pain is worse and she thinks she has over done it with working and school.  History reviewed. No pertinent past medical history.  Past Surgical History  Procedure Laterality Date  . Back surgery    . Cholecystectomy    . Tonsillectomy      No family history on file.  History  Substance Use Topics  . Smoking status: Current Every Day Smoker -- 1.00 packs/day    Types: Cigarettes  . Smokeless tobacco: Not on file  . Alcohol Use: No    OB History   Grav Para Term Preterm Abortions TAB SAB Ect Mult Living                  Review of Systems  Constitutional: Negative for fever, chills and weight loss.  HENT: Negative for neck pain.   Gastrointestinal: Negative for nausea,  vomiting, abdominal pain and bowel incontinence.  Genitourinary: Negative for bladder incontinence, dysuria, urgency and frequency.  Musculoskeletal: Positive for back pain.  Skin: Negative for rash.  Neurological: Negative for headaches.  Psychiatric/Behavioral: The patient is not nervous/anxious.     Allergies  Review of patient's allergies indicates no known allergies.  Home Medications   Current Outpatient Rx  Name  Route  Sig  Dispense  Refill  . acetaminophen (TYLENOL) 325 MG tablet   Oral   Take 650 mg by mouth as needed. For pain         . cyclobenzaprine (FLEXERIL) 10 MG tablet   Oral   Take 1 tablet (10 mg total) by mouth 3 (three) times daily as needed for muscle spasms.   21 tablet   0   . HYDROcodone-acetaminophen (NORCO) 7.5-325 MG per tablet   Oral   Take 1 tablet by mouth every 6 (six) hours as needed for pain.   20 tablet   0   . predniSONE (DELTASONE) 10 MG tablet      Take 6 tablets day one, 5 tablets day two, 4 tablets day three, 3 tablets day four, 2 tablets day five, then 1 tablet day six   21 tablet   0     BP 124/70  Pulse 75  Temp(Src) 97.5 F (36.4 C) (Oral)  Resp  16  Ht 5\' 3"  (1.6 m)  Wt 167 lb (75.751 kg)  BMI 29.59 kg/m2  SpO2 98%  Physical Exam  Nursing note and vitals reviewed. Constitutional: She is oriented to person, place, and time. She appears well-developed and well-nourished. No distress.  HENT:  Head: Normocephalic.  Eyes: EOM are normal.  Neck: Normal range of motion. Neck supple.  Cardiovascular: Normal rate and regular rhythm.   Pulmonary/Chest: Effort normal and breath sounds normal.  Abdominal: Soft. Bowel sounds are normal. There is no tenderness.  Musculoskeletal:       Lumbar back: She exhibits decreased range of motion, tenderness and spasm.       Back:  Pedal pulses strong and equal bilateral. Adequate circulation, good touch sensation.   Neurological: She is alert and oriented to person, place, and  time. She has normal strength and normal reflexes. No cranial nerve deficit or sensory deficit.  Pain with straight leg raises on the right.  Skin: Skin is warm and dry.  Psychiatric: She has a normal mood and affect. Her behavior is normal.    ED Course  Procedures (including critical care time)   MDM  46 y.o. female with low back pain and pain over right sciatic nerve. Patient stable for discharge home without any neurologic deficits at this time.   Discussed with the patient clinical findings and plan of care.All questioned fully answered. She will return if any problems arise.    Medication List    STOP taking these medications       acetaminophen 325 MG tablet  Commonly known as:  TYLENOL     HYDROcodone-acetaminophen 7.5-325 MG per tablet  Commonly known as:  NORCO     predniSONE 10 MG tablet  Commonly known as:  DELTASONE      TAKE these medications       cyclobenzaprine 10 MG tablet  Commonly known as:  FLEXERIL  Take 1 tablet (10 mg total) by mouth 2 (two) times daily as needed for muscle spasms.     HYDROcodone-acetaminophen 5-325 MG per tablet  Commonly known as:  NORCO/VICODIN  Take 1 tablet by mouth every 4 (four) hours as needed.               [facility] Orlene Och, [place] 07/25/12 [phone]

## 2012-07-24 NOTE — ED Notes (Signed)
Chronic back pain. 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

## 2012-07-25 NOTE — ED Provider Notes (Signed)
Medical screening examination/treatment/procedure(s) were performed by non-physician practitioner and as supervising physician I was immediately available for consultation/collaboration.    Vida Roller, MD 07/25/12 1056

## 2013-02-18 ENCOUNTER — Emergency Department (HOSPITAL_COMMUNITY)
Admission: EM | Admit: 2013-02-18 | Discharge: 2013-02-18 | Disposition: A | Discharge status: Home / Self Care | Payer: Medicaid Other | Attending: Emergency Medicine | Admitting: Emergency Medicine

## 2013-02-18 ENCOUNTER — Encounter (HOSPITAL_COMMUNITY): Payer: Self-pay | Admitting: Emergency Medicine

## 2013-02-18 ENCOUNTER — Emergency Department (HOSPITAL_COMMUNITY): Payer: Medicaid Other

## 2013-02-18 DIAGNOSIS — M549 Dorsalgia, unspecified: Secondary | ICD-10-CM

## 2013-02-18 DIAGNOSIS — Z9889 Other specified postprocedural states: Secondary | ICD-10-CM | POA: Insufficient documentation

## 2013-02-18 DIAGNOSIS — F17200 Nicotine dependence, unspecified, uncomplicated: Secondary | ICD-10-CM | POA: Insufficient documentation

## 2013-02-18 DIAGNOSIS — M545 Low back pain, unspecified: Secondary | ICD-10-CM | POA: Insufficient documentation

## 2013-02-18 MED ORDER — SODIUM CHLORIDE 0.9 % IV BOLUS (SEPSIS)
500.0000 mL | Freq: Once | INTRAVENOUS | Status: AC
  Administered 2013-02-18: 500 mL via INTRAVENOUS

## 2013-02-18 MED ORDER — ONDANSETRON HCL 4 MG/2ML IJ SOLN
4.0000 mg | Freq: Once | INTRAMUSCULAR | Status: AC
  Administered 2013-02-18: 4 mg via INTRAVENOUS
  Filled 2013-02-18: qty 2

## 2013-02-18 MED ORDER — MORPHINE SULFATE 4 MG/ML IJ SOLN
4.0000 mg | Freq: Once | INTRAMUSCULAR | Status: AC
  Administered 2013-02-18: 4 mg via INTRAVENOUS
  Filled 2013-02-18: qty 1

## 2013-02-18 MED ORDER — KETOROLAC TROMETHAMINE 30 MG/ML IJ SOLN
30.0000 mg | Freq: Once | INTRAMUSCULAR | Status: AC
  Administered 2013-02-18: 30 mg via INTRAVENOUS
  Filled 2013-02-18: qty 1

## 2013-02-18 MED ORDER — CYCLOBENZAPRINE HCL 10 MG PO TABS: 10.0000 mg | ORAL_TABLET | Freq: Two times a day (BID) | ORAL | Status: DC | PRN

## 2013-02-18 MED ORDER — OXYCODONE-ACETAMINOPHEN 5-325 MG PO TABS: 2.0000 | ORAL_TABLET | ORAL | Status: DC | PRN

## 2013-02-18 MED ORDER — PREDNISONE 20 MG PO TABS: ORAL_TABLET | ORAL | Status: DC

## 2013-02-18 NOTE — ED Notes (Addendum)
Pt c/o lower back pain that radiates down left leg, started a few days ago, worse this am, denies any recent injury, denies any problems with bowel movements but states that she has been having "urine leaking" today. Pt ambulatory to triage,

## 2013-02-18 NOTE — Discharge Instructions (Signed)
Rest, ice pack to back, medication for pain, inflammation, muscle relaxation. Recommend follow up with neurosurgeon in [place].

## 2013-02-18 NOTE — ED Provider Notes (Signed)
CSN: [id number]     Arrival date & time 02/18/13  1356 History  This chart was scribed for Donnetta HutchingBrian Leondre Taul, MD by Lindajo Royallujie Ifegwu, ED Scribe. This patient was seen in room APA01/APA01 and the patient's care was started at 2:48 PM.  Chief Complaint  Patient presents with  . Back Pain    The history is provided by the patient. No language interpreter was used.   HPI Comments: Christy Hopkins is a 48 y.o. female who presents to the Emergency Department complaining of 2 days of persistent, progressively-worsening severe lower back pain.  Pt localizes pain to bilateral lower back with radiation down left posterior leg to the ankle.  Pain began on the right side and yesterday moved to the left side, and greatly worsened this morning.  She has attempted to treat pain with ibuprofen and Tylenol, without relief.  Pt admits to h/o previous lower back surgery in that area (Dr. Daleen SquibbWall) around 10 years ago, and reports experiencing similar pain prior to that surgery.  She states that after her surgery she had only some mild pain occasionally until her current flare-up.  She denies any recent injuries or unusual activities.  No bowel or bladder incontinence. Pt has no PCP   History reviewed. No pertinent past medical history.   Past Surgical History  Procedure Laterality Date  . Back surgery    . Cholecystectomy    . Tonsillectomy      No family history on file.   History  Substance Use Topics  . Smoking status: Current Every Day Smoker -- 1.00 packs/day    Types: Cigarettes  . Smokeless tobacco: Not on file  . Alcohol Use: No    OB History   Grav Para Term Preterm Abortions TAB SAB Ect Mult Living                  Review of Systems A complete 10 system review of systems was obtained and all systems are negative except as noted in the HPI and PMH.    Allergies  Review of patient's allergies indicates no known allergies.  Home Medications   Current Outpatient Rx  Name  Route  Sig  Dispense   Refill  . cyclobenzaprine (FLEXERIL) 10 MG tablet   Oral   Take 1 tablet (10 mg total) by mouth 2 (two) times daily as needed for muscle spasms.   20 tablet   0   . HYDROcodone-acetaminophen (NORCO/VICODIN) 5-325 MG per tablet   Oral   Take 1 tablet by mouth every 4 (four) hours as needed.   15 tablet   0    BP 132/82  Pulse 85  Temp(Src) 97.6 F (36.4 C) (Oral)  Resp 18  Ht 5\' 2"  (1.575 m)  Wt 165 lb (74.844 kg)  BMI 30.17 kg/m2  SpO2 97%  Physical Exam  Nursing note and vitals reviewed. Constitutional: She is oriented to person, place, and time. She appears well-developed and well-nourished.  HENT:  Head: Normocephalic and atraumatic.  Eyes: Conjunctivae and EOM are normal. Pupils are equal, round, and reactive to light.  Neck: Normal range of motion. Neck supple.  Cardiovascular: Normal rate, regular rhythm and normal heart sounds.   Pulmonary/Chest: Effort normal and breath sounds normal.  Abdominal: Soft. Bowel sounds are normal.  Musculoskeletal: Normal range of motion. She exhibits tenderness.  Tenderness in lower lumbar spine  Neurological: She is alert and oriented to person, place, and time.  Skin: Skin is warm and dry.  Psychiatric:  She has a normal mood and affect. Her behavior is normal.    ED Course  Procedures (including critical care time)  DIAGNOSTIC STUDIES: Oxygen Saturation is 97% on room air, normal by my interpretation.    COORDINATION OF CARE: 2:50 PM-Discussed treatment plan which includes pain medication and back x-ray with pt at bedside and pt agreed to plan.    Labs Review Labs Reviewed - No data to display  Imaging Review Dg Lumbar Spine Complete  02/18/2013   CLINICAL DATA:  Back pain.  EXAM: LUMBAR SPINE - COMPLETE 4+ VIEW  COMPARISON:  12/24/2010.  FINDINGS: Stable degenerative disc disease at L5-S1 with disc space narrowing osteophytic spurring. There is also stable facet disease at L4-5 and L5-S1 with mild anterolisthesis of  L4. No acute bony findings. No pars defects. The visualized bony pelvis is intact. The SI joints appear normal.  IMPRESSION: Stable to degenerative disc disease and degenerate facet disease in the lower lumbar spine. No acute findings.   Electronically Signed   By: Loralie Champagne M.D.   On: 02/18/2013 15:56    EKG Interpretation   None       MDM  No diagnosis found. Patient has had previous disc surgery in lower back. History and physical is suggestive of herniated disc with pain radiating to left leg.   Patient feels better after pain management. Refer to neurosurgery in [place]. Discharge medications Percocet, prednisone, Flexeril 10 mg    I personally performed the services described in this documentation, which was scribed in my presence. The recorded information has been reviewed and is accurate.    Donnetta Hutching, MD 02/18/13 [phone]

## 2013-02-18 NOTE — ED Notes (Signed)
Pt c/o lower back pain that radiates down left leg x2 days. Pt has hx of back pain and states she is usually able to manage pain at home but denies relief with her current back pain.

## 2013-02-21 ENCOUNTER — Encounter (HOSPITAL_COMMUNITY): Payer: Self-pay | Admitting: Emergency Medicine

## 2013-02-21 ENCOUNTER — Emergency Department (HOSPITAL_COMMUNITY)
Admission: EM | Admit: 2013-02-21 | Discharge: 2013-02-21 | Disposition: A | Discharge status: Home / Self Care | Payer: Medicaid Other | Attending: Emergency Medicine | Admitting: Emergency Medicine

## 2013-02-21 DIAGNOSIS — M5416 Radiculopathy, lumbar region: Secondary | ICD-10-CM

## 2013-02-21 DIAGNOSIS — F17200 Nicotine dependence, unspecified, uncomplicated: Secondary | ICD-10-CM | POA: Insufficient documentation

## 2013-02-21 DIAGNOSIS — Z9889 Other specified postprocedural states: Secondary | ICD-10-CM | POA: Insufficient documentation

## 2013-02-21 DIAGNOSIS — IMO0002 Reserved for concepts with insufficient information to code with codable children: Secondary | ICD-10-CM | POA: Insufficient documentation

## 2013-02-21 DIAGNOSIS — M79609 Pain in unspecified limb: Secondary | ICD-10-CM | POA: Insufficient documentation

## 2013-02-21 DIAGNOSIS — G8929 Other chronic pain: Secondary | ICD-10-CM | POA: Insufficient documentation

## 2013-02-21 HISTORY — DX: Other chronic pain: G89.29

## 2013-02-21 HISTORY — DX: Dorsalgia, unspecified: M54.9

## 2013-02-21 MED ORDER — DIAZEPAM 5 MG PO TABS
5.0000 mg | ORAL_TABLET | Freq: Once | ORAL | Status: AC
  Administered 2013-02-21: 5 mg via ORAL
  Filled 2013-02-21: qty 1

## 2013-02-21 MED ORDER — DICLOFENAC SODIUM 75 MG PO TBEC: 75.0000 mg | DELAYED_RELEASE_TABLET | Freq: Two times a day (BID) | ORAL | Status: DC

## 2013-02-21 MED ORDER — IBUPROFEN 800 MG PO TABS
800.0000 mg | ORAL_TABLET | Freq: Once | ORAL | Status: AC
  Administered 2013-02-21: 800 mg via ORAL
  Filled 2013-02-21: qty 1

## 2013-02-21 NOTE — ED Provider Notes (Signed)
CSN: [id number]     Arrival date & time 02/21/13  1515 History   First MD Initiated Contact with Patient 02/21/13 1601     Chief Complaint  Patient presents with  . Back Pain   (Consider location/radiation/quality/duration/timing/severity/associated sxs/prior Treatment) Patient is a 48 y.o. female presenting with back pain. The history is provided by the patient.  Back Pain Location:  Lumbar spine Quality:  Aching Radiates to:  L posterior upper leg, L thigh and L knee Pain severity:  Moderate Pain is:  Same all the time Onset quality:  Gradual Duration:  1 week Timing:  Constant Progression:  Worsening Chronicity:  Recurrent Context: twisting   Context: not falling and not lifting heavy objects   Relieved by: standing. Worsened by:  Bending, twisting and sitting Ineffective treatments:  OTC medications Associated symptoms: leg pain   Associated symptoms: no abdominal pain, no abdominal swelling, no bladder incontinence, no bowel incontinence, no chest pain, no dysuria, no fever, no headaches, no numbness, no paresthesias, no pelvic pain, no perianal numbness, no tingling and no weakness     Past Medical History  Diagnosis Date  . Chronic back pain    Past Surgical History  Procedure Laterality Date  . Back surgery    . Cholecystectomy    . Tonsillectomy     History reviewed. No pertinent family history. History  Substance Use Topics  . Smoking status: Current Every Day Smoker -- 1.00 packs/day    Types: Cigarettes  . Smokeless tobacco: Not on file  . Alcohol Use: No   OB History   Grav Para Term Preterm Abortions TAB SAB Ect Mult Living                 Review of Systems  Constitutional: Negative for fever.  Respiratory: Negative for shortness of breath.   Cardiovascular: Negative for chest pain.  Gastrointestinal: Negative for vomiting, abdominal pain, constipation and bowel incontinence.  Genitourinary: Negative for bladder incontinence, dysuria, hematuria,  flank pain, decreased urine volume, difficulty urinating and pelvic pain.       No perineal numbness or incontinence of urine or feces  Musculoskeletal: Positive for back pain. Negative for joint swelling.  Skin: Negative for rash.  Neurological: Negative for dizziness, tingling, weakness, numbness, headaches and paresthesias.  All other systems reviewed and are negative.    Allergies  Review of patient's allergies indicates no known allergies.  Home Medications   Current Outpatient Rx  Name  Route  Sig  Dispense  Refill  . acetaminophen (TYLENOL) 500 MG tablet   Oral   Take 1,500 mg by mouth daily as needed for mild pain or moderate pain.         . cyclobenzaprine (FLEXERIL) 10 MG tablet   Oral   Take 1 tablet (10 mg total) by mouth 2 (two) times daily as needed for muscle spasms.   20 tablet   0   . ibuprofen (ADVIL,MOTRIN) 200 MG tablet   Oral   Take 400-800 mg by mouth 2 (two) times daily as needed for headache or moderate pain.         . predniSONE (DELTASONE) 20 MG tablet      3 tabs po day one, then 2 po daily x 4 days   11 tablet   0    BP 156/98  Pulse 96  Temp(Src) 97.8 F (36.6 C) (Oral)  Resp 20  Ht 5\' 2"  (1.575 m)  Wt 160 lb (72.576 kg)  BMI 29.26 kg/m2  SpO2 99% Physical Exam  Nursing note and vitals reviewed. Constitutional: She is oriented to person, place, and time. She appears well-developed and well-nourished. No distress.  HENT:  Head: Normocephalic and atraumatic.  Neck: Normal range of motion. Neck supple.  Cardiovascular: Normal rate, regular rhythm, normal heart sounds and intact distal pulses.   No murmur heard. Pulmonary/Chest: Effort normal and breath sounds normal. No respiratory distress.  Abdominal: Soft. She exhibits no distension. There is no tenderness.  Musculoskeletal: She exhibits tenderness. She exhibits no edema.       Lumbar back: She exhibits tenderness and pain. She exhibits normal range of motion, no swelling, no  deformity, no laceration and normal pulse.       Back:  ttp of the left lumbar paraspinal muscles and SI joint.  No spinal tenderness.  DP pulses are brisk and symmetrical.  Distal sensation intact.  Hip Flexors/Extensors are intact  Neurological: She is alert and oriented to person, place, and time. She has normal strength. No sensory deficit. She exhibits normal muscle tone. Coordination and gait normal.  Reflex Scores:      Patellar reflexes are 2+ on the right side and 2+ on the left side.      Achilles reflexes are 2+ on the right side and 2+ on the left side. Skin: Skin is warm and dry. No rash noted.    ED Course  Procedures (including critical care time) Labs Review Labs Reviewed - No data to display Imaging Review No results found.  EKG Interpretation   None       MDM    Previous ED chart and imaging reviewed.  Pt seen here 3 days ago for same.  Patient is ambulatory, no focal neurological deficits.  Sx's likely related to lumbar radiculopathy.  Patient recently seen here for same.  Has flexeril.  Prescribed #14 diclofenac for pain.  No concerning sx's for emergent neurological or infectious process. Appears stable for d/c, referral info given  Reyes Fifield L. Trisha Mangle, PA-C 02/23/13 1246

## 2013-02-21 NOTE — ED Notes (Signed)
[facility] rback pain x 1 week. Here Sunday for same. No pcp. Pt needs to stand for comfort in triage. Pain going down left leg. Nad.

## 2013-02-21 NOTE — Discharge Instructions (Signed)
Lumbosacral Radiculopathy Lumbosacral radiculopathy is a pinched nerve or nerves in the low back (lumbosacral area). When this happens you may have weakness in your legs and may not be able to stand on your toes. You may have pain going down into your legs. There may be difficulties with walking normally. There are many causes of this problem. Sometimes this may happen from an injury, or simply from arthritis or boney problems. It may also be caused by other illnesses such as diabetes. If there is no improvement after treatment, further studies may be done to find the exact cause. DIAGNOSIS  X-rays may be needed if the problems become long standing. Electromyograms may be done. This study is one in which the working of nerves and muscles is studied. HOME CARE INSTRUCTIONS   Applications of ice packs may be helpful. Ice can be used in a plastic bag with a towel around it to prevent frostbite to skin. This may be used every 2 hours for 20 to 30 minutes, or as needed, while awake, or as directed by your caregiver.  Only take over-the-counter or prescription medicines for pain, discomfort, or fever as directed by your caregiver.  If physical therapy was prescribed, follow your caregiver's directions. SEEK IMMEDIATE MEDICAL CARE IF:   You have pain not controlled with medications.  You seem to be getting worse rather than better.  You develop increasing weakness in your legs.  You develop loss of bowel or bladder control.  You have difficulty with walking or balance, or develop clumsiness in the use of your legs.  You have a fever. MAKE SURE YOU:   Understand these instructions.  Will watch your condition.  Will get help right away if you are not doing well or get worse. Document Released: 01/25/2005 Document Revised: 04/19/2011 Document Reviewed: 09/15/2007 [facility] Patient Information 2014 [place], [place].    Emergency Department Resource Guide 1) Find a Doctor and Pay Out of  Pocket Although you won't have to find out who is covered by your insurance plan, it is a good idea to ask around and get recommendations. You will then need to call the office and see if the doctor you have chosen will accept you as a new patient and what types of options they offer for patients who are self-pay. Some doctors offer discounts or will set up payment plans for their patients who do not have insurance, but you will need to ask so you aren't surprised when you get to your appointment.  2) Contact Your Local Health Department Not all health departments have doctors that can see patients for sick visits, but many do, so it is worth a call to see if yours does. If you don't know where your local health department is, you can check in your phone book. The CDC also has a tool to help you locate your state's health department, and many state websites also have listings of all of their local health departments.  3) Find a Walk-in Clinic If your illness is not likely to be very severe or complicated, you may want to try a walk in clinic. These are popping up all over the country in pharmacies, drugstores, and shopping centers. They're usually staffed by nurse practitioners or physician assistants that have been trained to treat common illnesses and complaints. They're usually fairly quick and inexpensive. However, if you have serious medical issues or chronic medical problems, these are probably not your best option.  No Primary Care Doctor: - Call Health Connect  at  [phone] - they can help you locate a primary care doctor that  accepts your insurance, provides certain services, etc. - Physician Referral Service- [phone]  Chronic Pain Problems: Organization         Address  Phone   Notes  [facility]  [phone] Patients need to be referred by their primary care doctor.   Medication Assistance: Organization         Address  Phone   Notes  [facility]  Medication [facility] [address] [place] [place], [place] [address] [phone] --Must be a resident of [facility] -- Must have NO insurance coverage whatsoever (no Medicaid/ Medicare, etc.) -- The pt. MUST have a primary care doctor that directs their care regularly and follows them in the community   MedAssist  [phone]   [employer]  [phone]    Agencies that provide inexpensive medical care: Organization         Address  Phone   Notes  [facility]  [phone]   [facility]    [phone]   [facility] [address] [place], [place] [address] [phone]   [facility] [address] [place] [place] [phone]   Planned Parenthood    [phone]   [facility]    [phone]   [facility]  [address], [place] Phone:  [phone], Fax:  [phone] Hours of Operation:  9 am - 6 pm, M-F.  Also accepts Medicaid/Medicare and self-pay.  [facility]  [address], [place] Phone: [phone], Fax: [phone]. Hours of Operation:  8:30 am - 5:30 pm, M-F.  Also accepts Medicaid and self-pay.  [facility] [place] [address], [place] Phone: [phone]   [facility] [address] Natasha Bence [place], [place] [phone], Ext. [phone] Mondays & Thursdays: 7-9 AM.  First 15 patients are seen on a first come, first serve basis.    Medicaid-accepting [facility] Providers:  Organization         Address  Phone   Notes  [facility] [address],  [phone] Also accepts self-pay patients.  [facility] [address] Laurell Josephs [place], [place]  [phone]   [facility] [address], [place] [phone]   [facility] [address] [place] [phone]   Renaye Rakers [address], [place]   [phone] Only accepts [place] Access [place] patients after they have their name applied to their card.   Self-Pay (no insurance) in [facility]:  Organization         Address  Phone   Notes  Sickle Cell Patients, [facility] Internal Medicine [address] [place], [place] [phone]   [facility] [address] [place], [place] [phone]   [facility] [place]  [address], [place] [phone]   [facility]/Dr. Osei-Bonsu  [address], [place] or [address], [place] [phone] Phone number for both [place] and [place] locations is the same.  Urgent Medical and [facility] [address] Ginette Otto [phone]   [facility] [address], [place] or [address] [phone]   [facility] [address] [place], [place] [phone], phone; [phone], fax Sees patients 1st and 3rd Saturday of every month.  Must not qualify for public or private insurance (i.e. Medicaid, Medicare, [place] Health Choice, Veterans' Benefits)  Household income should be no more than 200% of the poverty level The clinic cannot treat you if you are pregnant or think you are pregnant  Sexually transmitted diseases are not treated at the clinic.    Dental Care: Organization         Address  Phone  Notes  [facility] Department of [facility] [address] [place], [place] [phone] Accepts children up to age 65 who are enrolled in [place] or [place] Health Choice; pregnant women with a Medicaid card; and children who have applied for Medicaid or [place] Health Choice, but were declined, whose parents can pay a reduced fee at time of service.  [facility] Department of [facility]  [address], [place]  [phone] Accepts children up to age 52 who are enrolled in [place] or [place] Health Choice; pregnant women with a Medicaid card; and children who have applied for Medicaid or  Health Choice, but were declined, whose parents can pay a reduced fee at time of service.  [facility]  [address] [place], [place] [phone] Patients are seen by appointment only. Walk-ins are not accepted. [facility] will see patients 83 years of age and older. Monday - Tuesday (8am-5pm) Most Wednesdays (8:30-5pm) $30 per visit, cash only  [facility]  [address], [facility] [phone] Patients are seen by appointment only. Walk-ins are not accepted. [facility] will see patients 54 years of age and older. One Wednesday Evening (Monthly: Volunteer Based).  $30 per visit, cash only  [employer]  [phone] for adults; Children under age 63, call Graduate Pediatric Dentistry at [phone]. Children aged 56-14, please call [phone] to request a pediatric application.  Dental services are provided in all areas of dental care including fillings, crowns and bridges, complete and partial dentures, implants, gum treatment, root canals, and extractions. Preventive care is also provided. Treatment is provided to both adults and children. Patients are selected via a lottery and there is often a waiting list.   [facility] [address] [place]  [phone] [email or web address]   [facility] [address] [place], [place] [phone], Ext. [phone] Second and Fourth Thursday of each month, opens at 6:30 AM; Clinic ends at 9 AM.  Patients are seen on a first-come first-served basis, and a limited number are seen during each clinic.   [facility]  [address] Ether [place], [place] [phone]   Eligibility Requirements You must have lived in [place], [place], or [place]  counties for at least the last three months.   You cannot be eligible for state or federal sponsored [place], including [employer], [place], or [employer].   You generally cannot be eligible for healthcare insurance through your employer.    How to apply: Eligibility screenings are held every Tuesday and Wednesday afternoon from 1:00 pm until 4:00 pm. You do not need an appointment for the interview!  [facility] [address], [place], [place] [phone]   [facility] Department  [phone]  [employer] Department  [place]  [phone]    Behavioral Health Resources in the Community: Intensive Outpatient Programs Organization         Address  Phone  Notes  Virgie [place]. [address] [place], [place] [phone]   [facility] Outpatient [address] [place], [place]   ADS: Alcohol & Drug Svcs [address] [place], [place]   [place] [address],  [place], [place] or [phone]   Substance Abuse Resources Organization         Address  Phone  Notes  Alcohol and Drug Services  [phone]   [place]  [phone]   [facility]   Chinita Pester  [phone]   Residential & Outpatient Substance Abuse Program  [phone]   Psychological Services Organization         Address  Phone  Notes  [facility]  [place]  [phone]   [place] [address] [place] or [phone]    Mobile Crisis Teams Organization         Address  Phone  Notes  [facility], Mobile Crisis Care Unit  [phone]   [facility]  [address] [place], [place]   [place] Levels [address] [place] [phone]    Self-Help/Support Groups Organization         Address  Phone             Notes  [place]. of [place] - variety of support groups  [place] Call for more information  Narcotics Anonymous (NA), [facility] [address], [employer] [place]  2 meetings at this location   Special educational needs teacher         Address  Phone  Notes  [facility] [place],    [place]  [phone]   [facility]  [address] [place] [address], [place], [place]   [place], [place] [phone] Admissions: 8am-3pm M-F  Incentives Substance [place] [address]    [place], [place] [phone]   [facility] [address] [place], [place], [place]   [facility] [address],  [place], [place]   [facility] Programs - Intensive Outpatient [address] Kristeen Mans [address], [place], [place]   [facility] ([place].,  [place], [place] [phone] or [phone]   [facility] ([facility]) [address] [place], [place] Accepts Medicaid  [facility] [place] [address]  [place] [phone] Substance Abuse/Addiction Treatment   [facility] Organization         Address  Phone  Notes  [facility]  [phone]   Domenic Schwab, PhD [address] Arlis Porta [place], [place]   [phone] or [phone]   Humphreys [place], [place] [phone]   [place] [address], [place], [place] [phone] Insurance/Medicaid/sponsorship through [employer] and Families [address]  [place], [place] [phone] [place], [place] [phone]    Dr. Adele Schilder  [phone]   [facility] Dept. 1) [address] [place] 2) [place] 3)  [place] [address], [place] [phone]  [phone]   [place] [phone] or [phone] (After Hours)

## 2013-02-24 NOTE — ED Provider Notes (Signed)
Medical screening examination/treatment/procedure(s) were performed by non-physician practitioner and as supervising physician I was immediately available for consultation/collaboration.  EKG Interpretation   None        Ruther Ephraim, MD 02/24/13 1549 

## 2013-03-09 ENCOUNTER — Ambulatory Visit: Payer: Self-pay | Admitting: Family Medicine

## 2014-07-21 ENCOUNTER — Encounter (HOSPITAL_COMMUNITY): Payer: Self-pay | Admitting: Emergency Medicine

## 2014-07-21 ENCOUNTER — Emergency Department (HOSPITAL_COMMUNITY)
Admission: EM | Admit: 2014-07-21 | Discharge: 2014-07-21 | Disposition: A | Discharge status: Home / Self Care | Payer: Medicaid Other | Attending: Emergency Medicine | Admitting: Emergency Medicine

## 2014-07-21 DIAGNOSIS — G8929 Other chronic pain: Secondary | ICD-10-CM | POA: Diagnosis not present

## 2014-07-21 DIAGNOSIS — R110 Nausea: Secondary | ICD-10-CM | POA: Insufficient documentation

## 2014-07-21 DIAGNOSIS — Z7952 Long term (current) use of systemic steroids: Secondary | ICD-10-CM | POA: Diagnosis not present

## 2014-07-21 DIAGNOSIS — Z720 Tobacco use: Secondary | ICD-10-CM | POA: Insufficient documentation

## 2014-07-21 DIAGNOSIS — K029 Dental caries, unspecified: Secondary | ICD-10-CM | POA: Diagnosis not present

## 2014-07-21 DIAGNOSIS — K088 Other specified disorders of teeth and supporting structures: Secondary | ICD-10-CM | POA: Diagnosis present

## 2014-07-21 MED ORDER — NAPROXEN 500 MG PO TABS: 500.0000 mg | ORAL_TABLET | Freq: Two times a day (BID) | ORAL | Status: DC

## 2014-07-21 MED ORDER — HYDROCODONE-ACETAMINOPHEN 5-325 MG PO TABS
1.0000 | ORAL_TABLET | Freq: Once | ORAL | Status: AC
  Administered 2014-07-21: 1 via ORAL
  Filled 2014-07-21: qty 1

## 2014-07-21 MED ORDER — AMOXICILLIN 250 MG PO CAPS
500.0000 mg | ORAL_CAPSULE | Freq: Once | ORAL | Status: AC
  Administered 2014-07-21: 500 mg via ORAL
  Filled 2014-07-21: qty 2

## 2014-07-21 MED ORDER — AMOXICILLIN 500 MG PO CAPS: 500.0000 mg | ORAL_CAPSULE | Freq: Three times a day (TID) | ORAL | Status: DC

## 2014-07-21 NOTE — ED Notes (Signed)
PT reports left upper dental pain for 1 week.

## 2014-07-21 NOTE — ED Provider Notes (Signed)
CSN: [id number]     Arrival date & time 07/21/14  1544 History  This chart was scribed for non-physician practitioner, [facility] M. Damian Leavell, NP,  working with Raeford Razor, MD, by Roxy Cedar ED Scribe. This patient was seen in room APFT22/APFT22 and the patient's care was started at 5:28 PM    Chief Complaint  Patient presents with  . Dental Pain   Patient is a 49 y.o. female presenting with tooth pain. The history is provided by the patient. No language interpreter was used.  Dental Pain Associated symptoms: no fever     HPI Comments: Christy Hopkins is a 49 y.o. female who presents to the Emergency Department complaining of moderate intermittent dental pain onset 1 week ago. She reports hx of dental extractions in the past. She reports increased pain to left center incisor. Reports associated nausea. She denies associated fever, chills, vomiting or otalgia. Patient is scheduled to see her dentist in a few days.  Past Medical History  Diagnosis Date  . Chronic back pain    Past Surgical History  Procedure Laterality Date  . Back surgery    . Cholecystectomy    . Tonsillectomy     History reviewed. No pertinent family history. History  Substance Use Topics  . Smoking status: Current Every Day Smoker -- 1.00 packs/day    Types: Cigarettes  . Smokeless tobacco: Not on file  . Alcohol Use: No   OB History    Gravida Para Term Preterm AB TAB SAB Ectopic Multiple Living            3     Review of Systems  Constitutional: Negative for fever and chills.  HENT: Positive for dental problem. Negative for ear pain.   Gastrointestinal: Positive for nausea. Negative for vomiting.  all other systems negative  Allergies  Review of patient's allergies indicates no known allergies.  Home Medications   Prior to Admission medications   Medication Sig Start Date End Date Taking? Authorizing Provider  amoxicillin (AMOXIL) 500 MG capsule Take 1 capsule (500 mg total) by mouth 3 (three)  times daily. 07/21/14   Autumm Hattery Orlene Och, NP  cyclobenzaprine (FLEXERIL) 10 MG tablet Take 1 tablet (10 mg total) by mouth 2 (two) times daily as needed for muscle spasms. 02/18/13   Donnetta Hutching, MD  naproxen (NAPROSYN) 500 MG tablet Take 1 tablet (500 mg total) by mouth 2 (two) times daily. 07/21/14   Osamah Schmader Orlene Och, NP  predniSONE (DELTASONE) 20 MG tablet 3 tabs po day one, then 2 po daily x 4 days 02/18/13   Donnetta Hutching, MD   Triage Vitals: BP 122/76 mmHg  Pulse 79  Temp(Src) 98.2 F (36.8 C) (Oral)  Resp 22  SpO2 97%  Physical Exam  Constitutional: She is oriented to person, place, and time. She appears well-developed and well-nourished. No distress.  HENT:  Head: Normocephalic and atraumatic.  Right Ear: External ear normal.  Left Ear: External ear normal.  Mouth/Throat: Uvula is midline, oropharynx is clear and moist and mucous membranes are normal.    Tenderness to left central incisor and the right lower dental area. There is decay in the central incisor and is loose. Tenderness and erythema in the gum surrounding tooth.  Eyes: Conjunctivae and EOM are normal. Pupils are equal, round, and reactive to light.  Neck: Neck supple. No tracheal deviation present.  Cardiovascular: Normal rate.   Pulmonary/Chest: Effort normal. No respiratory distress.  Musculoskeletal: Normal range of motion.  Lymphadenopathy:  She has no cervical adenopathy.  Neurological: She is alert and oriented to person, place, and time.  Skin: Skin is warm and dry.  Psychiatric: She has a normal mood and affect. Her behavior is normal.  Nursing note and vitals reviewed.  ED Course  Procedures (including critical care time)  DIAGNOSTIC STUDIES: Oxygen Saturation is 97% on RA, normal by my interpretation.    COORDINATION OF CARE: 5:33 PM- Discussed plans to give patient amoxicillin and naproxen. Pt advised of plan for treatment and pt agrees.  Labs Review Labs Reviewed - No data to display  MDM  49 y.o.  female with dental pain due to caries. Stable for d/c without fever, trismus, or signs of systemic infection. Will treat with antibiotics and NSAIDS. She will follow up with a dentist next week as scheduled.   Final diagnoses:  Pain due to dental caries   I personally performed the services described in this documentation, which was scribed in my presence. The recorded information has been reviewed and is accurate.   [address] [place], [place] 07/23/14 6144  Raeford Razor, MD 07/26/14 [phone]

## 2014-07-21 NOTE — Discharge Instructions (Signed)
Dental Pain  A tooth ache may be caused by cavities (tooth decay). Cavities expose the nerve of the tooth to air and hot or cold temperatures. It may come from an infection or abscess (also called a boil or furuncle) around your tooth. It is also often caused by dental caries (tooth decay). This causes the pain you are having.  DIAGNOSIS   Your caregiver can diagnose this problem by exam.  TREATMENT   · If caused by an infection, it may be treated with medications which kill germs (antibiotics) and pain medications as prescribed by your caregiver. Take medications as directed.  · Only take over-the-counter or prescription medicines for pain, discomfort, or fever as directed by your caregiver.  · Whether the tooth ache today is caused by infection or dental disease, you should see your dentist as soon as possible for further care.  SEEK MEDICAL CARE IF:  The exam and treatment you received today has been provided on an emergency basis only. This is not a substitute for complete medical or dental care. If your problem worsens or new problems (symptoms) appear, and you are unable to meet with your dentist, call or return to this location.  SEEK IMMEDIATE MEDICAL CARE IF:   · You have a fever.  · You develop redness and swelling of your face, jaw, or neck.  · You are unable to open your mouth.  · You have severe pain uncontrolled by pain medicine.  MAKE SURE YOU:   · Understand these instructions.  · Will watch your condition.  · Will get help right away if you are not doing well or get worse.  Document Released: 01/25/2005 Document Revised: 04/19/2011 Document Reviewed: 09/13/2007  ExitCare® Patient Information ©2015 [employer]. This information is not intended to replace advice given to you by your health care provider. Make sure you discuss any questions you have with your health care provider.    Dental Caries  Dental caries is tooth decay. This decay can cause a hole in teeth (cavity) that can get bigger and  deeper over time.  HOME CARE  · Brush and floss your teeth. Do this at least two times a day.  · Use a fluoride toothpaste.  · Use a mouth rinse if told by your dentist or doctor.  · Eat less sugary and starchy foods. Drink less sugary drinks.  · Avoid snacking often on sugary and starchy foods. Avoid sipping often on sugary drinks.  · Keep regular checkups and cleanings with your dentist.  · Use fluoride supplements if told by your dentist or doctor.  · Allow fluoride to be applied to teeth if told by your dentist or doctor.  Document Released: 11/04/2007 Document Revised: 06/11/2013 Document Reviewed: 01/28/2012  ExitCare® Patient Information ©2015 [employer]. This information is not intended to replace advice given to you by your health care provider. Make sure you discuss any questions you have with your health care provider.

## 2014-12-29 ENCOUNTER — Encounter (HOSPITAL_COMMUNITY): Payer: Self-pay | Admitting: *Deleted

## 2014-12-29 ENCOUNTER — Emergency Department (HOSPITAL_COMMUNITY): Payer: Medicaid Other

## 2014-12-29 ENCOUNTER — Emergency Department (HOSPITAL_COMMUNITY)
Admission: EM | Admit: 2014-12-29 | Discharge: 2014-12-29 | Disposition: A | Discharge status: Home / Self Care | Payer: Medicaid Other | Attending: Emergency Medicine | Admitting: Emergency Medicine

## 2014-12-29 DIAGNOSIS — Y9289 Other specified places as the place of occurrence of the external cause: Secondary | ICD-10-CM | POA: Insufficient documentation

## 2014-12-29 DIAGNOSIS — G8929 Other chronic pain: Secondary | ICD-10-CM | POA: Diagnosis not present

## 2014-12-29 DIAGNOSIS — Y9301 Activity, walking, marching and hiking: Secondary | ICD-10-CM | POA: Diagnosis not present

## 2014-12-29 DIAGNOSIS — S93402A Sprain of unspecified ligament of left ankle, initial encounter: Secondary | ICD-10-CM | POA: Diagnosis not present

## 2014-12-29 DIAGNOSIS — X58XXXA Exposure to other specified factors, initial encounter: Secondary | ICD-10-CM | POA: Insufficient documentation

## 2014-12-29 DIAGNOSIS — F17210 Nicotine dependence, cigarettes, uncomplicated: Secondary | ICD-10-CM | POA: Insufficient documentation

## 2014-12-29 DIAGNOSIS — Y998 Other external cause status: Secondary | ICD-10-CM | POA: Diagnosis not present

## 2014-12-29 DIAGNOSIS — Z792 Long term (current) use of antibiotics: Secondary | ICD-10-CM | POA: Diagnosis not present

## 2014-12-29 DIAGNOSIS — Z791 Long term (current) use of non-steroidal anti-inflammatories (NSAID): Secondary | ICD-10-CM | POA: Diagnosis not present

## 2014-12-29 DIAGNOSIS — S99912A Unspecified injury of left ankle, initial encounter: Secondary | ICD-10-CM | POA: Diagnosis present

## 2014-12-29 MED ORDER — HYDROCODONE-ACETAMINOPHEN 5-325 MG PO TABS
1.0000 | ORAL_TABLET | Freq: Once | ORAL | Status: AC
  Administered 2014-12-29: 1 via ORAL
  Filled 2014-12-29: qty 1

## 2014-12-29 MED ORDER — DICLOFENAC SODIUM 50 MG PO TBEC: 50.0000 mg | DELAYED_RELEASE_TABLET | Freq: Two times a day (BID) | ORAL | Status: DC

## 2014-12-29 NOTE — ED Provider Notes (Signed)
CSN: [id number]     Arrival date & time 12/29/14  1959 History   First MD Initiated Contact with Patient 12/29/14 2010     Chief Complaint  Patient presents with  . Ankle Pain     (Consider location/radiation/quality/duration/timing/severity/associated sxs/prior Treatment) Patient is a 49 y.o. female presenting with ankle pain. The history is provided by the patient.  Ankle Pain Location:  Ankle Time since incident: earlier today. Injury: yes   Mechanism of injury comment:  Twisted Ankle location:  L ankle Pain details:    Quality:  Aching and shooting   Severity:  Moderate   Onset quality:  Sudden   Timing:  Constant   Progression:  Worsening Chronicity:  New Dislocation: no   Foreign body present:  No foreign bodies Associated symptoms: swelling    Christy Hopkins is a 49 y.o. female who presents to the ED with pain and swelling to the lateral aspect of the left ankle. She reports walking 2 miles to work this morning and while walking she stepped on a rock that caused her to turn her ankle. She denies any  Other injuries.   Past Medical History  Diagnosis Date  . Chronic back pain    Past Surgical History  Procedure Laterality Date  . Back surgery    . Cholecystectomy    . Tonsillectomy     No family history on file. Social History  Substance Use Topics  . Smoking status: Current Every Day Smoker -- 1.00 packs/day    Types: Cigarettes  . Smokeless tobacco: None  . Alcohol Use: No   OB History    Gravida Para Term Preterm AB TAB SAB Ectopic Multiple Living            3     Review of Systems  Musculoskeletal: Positive for joint swelling.       Left ankle pain  all other systems negative    Allergies  Review of patient's allergies indicates no known allergies.  Home Medications   Prior to Admission medications   Medication Sig Start Date End Date Taking? Authorizing Provider  acetaminophen (TYLENOL) 500 MG tablet Take 1,500 mg by mouth every 6 (six)  hours as needed for mild pain.   Yes Historical Provider, MD  amoxicillin (AMOXIL) 500 MG capsule Take 1 capsule (500 mg total) by mouth 3 (three) times daily. 07/21/14   Hope Orlene Och, NP  cyclobenzaprine (FLEXERIL) 10 MG tablet Take 1 tablet (10 mg total) by mouth 2 (two) times daily as needed for muscle spasms. 02/18/13   Donnetta Hutching, MD  naproxen (NAPROSYN) 500 MG tablet Take 1 tablet (500 mg total) by mouth 2 (two) times daily. 07/21/14   Hope Orlene Och, NP  predniSONE (DELTASONE) 20 MG tablet 3 tabs po day one, then 2 po daily x 4 days 02/18/13   Donnetta Hutching, MD   BP 151/89 mmHg  Pulse 69  Temp(Src) 97.5 F (36.4 C) (Oral)  Resp 20  Ht  (1.6 m)  Wt 165 lb (74.844 kg)  BMI 29.24 kg/m2  SpO2 99%  LMP 02/14/2012 Physical Exam  Constitutional: She is oriented to person, place, and time. She appears well-developed and well-nourished.  HENT:  Head: Normocephalic and atraumatic.  Eyes: EOM are normal.  Neck: Neck supple.  Cardiovascular: Normal rate.   Pulmonary/Chest: Effort normal.  Musculoskeletal:       Left ankle: She exhibits swelling and ecchymosis. She exhibits normal range of motion, no deformity, no laceration and  normal pulse. Tenderness. Lateral malleolus tenderness found. Achilles tendon normal.  Pedal pulses 2+, adequate circulation, good touch sensation. Plantar and dorsiflexion without difficulty. Swelling to the lateral aspect of the ankle and tender on palpation and range of motion.   Neurological: She is alert and oriented to person, place, and time. No cranial nerve deficit.  Skin: Skin is warm and dry.  Psychiatric: She has a normal mood and affect. Her behavior is normal.  Nursing note and vitals reviewed.   ED Course  Procedures (including critical care time) Labs Review Labs Reviewed - No data to display  Imaging Review Dg Ankle Complete Left  12/29/2014  CLINICAL DATA:  Twisted ankle. This occurred earlier today. Pain is increasing. EXAM: LEFT ANKLE  COMPLETE - 3+ VIEW COMPARISON:  None. FINDINGS: There is no evidence of fracture, dislocation, or joint effusion. There is no evidence of arthropathy or other focal bone abnormality. Marked lateral soft tissue swelling. IMPRESSION: Negative for fracture.  Marked lateral soft tissue swelling. Electronically Signed   By: Elsie StainJohn T Curnes M.D.   On: 12/29/2014 20:38    MDM  49 y.o. female with pain and swelling of the lateral aspect of the left ankle. Stable for d/c without focal neuro deficits. Placed in ASO, ice, elevation, crutches and follow up with ortho if symptoms worsen. Discussed with the patient and all questioned fully answered.    Final diagnoses:  Ankle sprain, left, initial encounter       [facility] Neese, NP 12/29/14 2111  Bethann BerkshireJoseph Zammit, MD 01/01/15 [phone]

## 2014-12-29 NOTE — ED Notes (Signed)
Pt twisted left ankle while walking to work t his am, swelling and pain noted to left ankle,

## 2015-09-18 ENCOUNTER — Emergency Department (HOSPITAL_COMMUNITY)
Admission: EM | Admit: 2015-09-18 | Discharge: 2015-09-19 | Disposition: A | Discharge status: Home / Self Care | Payer: Medicaid Other | Attending: Emergency Medicine | Admitting: Emergency Medicine

## 2015-09-18 ENCOUNTER — Emergency Department (HOSPITAL_COMMUNITY): Payer: Medicaid Other

## 2015-09-18 ENCOUNTER — Encounter (HOSPITAL_COMMUNITY): Payer: Self-pay | Admitting: Emergency Medicine

## 2015-09-18 DIAGNOSIS — E860 Dehydration: Secondary | ICD-10-CM | POA: Insufficient documentation

## 2015-09-18 DIAGNOSIS — R0602 Shortness of breath: Secondary | ICD-10-CM | POA: Diagnosis present

## 2015-09-18 DIAGNOSIS — R064 Hyperventilation: Secondary | ICD-10-CM | POA: Insufficient documentation

## 2015-09-18 DIAGNOSIS — F17210 Nicotine dependence, cigarettes, uncomplicated: Secondary | ICD-10-CM | POA: Insufficient documentation

## 2015-09-18 LAB — CBC
HEMATOCRIT: 49 % — AB (ref 36.0–46.0)
HEMOGLOBIN: 17 g/dL — AB (ref 12.0–15.0)
MCH: 30 pg (ref 26.0–34.0)
MCHC: 34.7 g/dL (ref 30.0–36.0)
MCV: 86.6 fL (ref 78.0–100.0)
Platelets: 277 10*3/uL (ref 150–400)
RBC: 5.66 MIL/uL — ABNORMAL HIGH (ref 3.87–5.11)
RDW: 13.9 % (ref 11.5–15.5)
WBC: 12.3 10*3/uL — ABNORMAL HIGH (ref 4.0–10.5)

## 2015-09-18 LAB — URINALYSIS, ROUTINE W REFLEX MICROSCOPIC
GLUCOSE, UA: NEGATIVE mg/dL
Leukocytes, UA: NEGATIVE
NITRITE: NEGATIVE
PH: 6 (ref 5.0–8.0)
Protein, ur: 100 mg/dL — AB

## 2015-09-18 LAB — BASIC METABOLIC PANEL
ANION GAP: 6 (ref 5–15)
BUN: 14 mg/dL (ref 6–20)
CHLORIDE: 104 mmol/L (ref 101–111)
CO2: 26 mmol/L (ref 22–32)
Calcium: 8.4 mg/dL — ABNORMAL LOW (ref 8.9–10.3)
Creatinine, Ser: 1.03 mg/dL — ABNORMAL HIGH (ref 0.44–1.00)
GFR calc Af Amer: >60 mL/min (ref >60)
GFR calc non Af Amer: >60 mL/min (ref >60)
GLUCOSE: 189 mg/dL — AB (ref 65–99)
Potassium: 3.4 mmol/L — ABNORMAL LOW (ref 3.5–5.1)
Sodium: 136 mmol/L (ref 135–145)

## 2015-09-18 LAB — URINE MICROSCOPIC-ADD ON

## 2015-09-18 LAB — TROPONIN I

## 2015-09-18 LAB — CBG MONITORING, ED: Glucose-Capillary: 212 mg/dL — ABNORMAL HIGH (ref 65–99)

## 2015-09-18 NOTE — ED Triage Notes (Signed)
Patient states "I gave plasma today and didn't feel very well then. Tonight I became really hot and passed out and I feel like it's hard to breathe." Denies chest pain. Also complaining of tingling to hands and feet at triage.

## 2015-09-18 NOTE — ED Notes (Signed)
Patient transported to X-ray 

## 2015-09-18 NOTE — ED Notes (Signed)
MD at bedside. 
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
## 2015-09-18 NOTE — ED Provider Notes (Signed)
[facility]-EMERGENCY DEPT Provider Note   CSN: [id number] Arrival date & time: 09/18/15  2114  First Provider Contact:  First MD Initiated Contact with Patient 09/18/15 2142        History   Chief Complaint Chief Complaint  Patient presents with  . Shortness of Breath  . Loss of Consciousness    HPI KONNER WARRIOR is a 50 y.o. female.  Patient is a 50 year old female who presents to the emergency department with a complaint of chest feeling heavy and sensation of shortness of breath.  The patient states that approximately an hour prior to her arrival to the emergency department she began having problems with a heavy sensation in her chest accompanied by numbness and tingling in her hands and feet as well as sensation that she could not get a good deep breath.  The patient states that approximate 6 PM today she gave blood plasma. She states that afterwards she did not feel well but thought maybe she just needed to eat. After eating, she walked outside and felt as though she was "on fire", and also felt weak. She went back inside, but states shortly after being inside she does not remember the events for a few moments. Her family members says that she had a near syncopal a possible syncopal episode. They state that her lips on changed colors on. She states that when she does recall call is that she felt that she could not breathe.  The patient has no diagnosed lung problems, however she is a smoker. She denies any heart problems. She is not diabetic and has not had any cold or flu symptoms recently. The patient states that she is a smoker. She smokes marijuana. Occasionally she uses cocaine. She states she has not used any recently. She has no history of any bleeding disorders, she's not been on any long trips, and has not had any long periods of activity.   The history is provided by the patient.    Past Medical History:  Diagnosis Date  . Chronic back pain     There are no active  problems to display for this patient.   Past Surgical History:  Procedure Laterality Date  . BACK SURGERY    . CHOLECYSTECTOMY    . TONSILLECTOMY      OB History    Gravida Para Term Preterm AB Living             3   SAB TAB Ectopic Multiple Live Births                   Home Medications    Prior to Admission medications   Medication Sig Start Date End Date Taking? Authorizing Provider  acetaminophen (TYLENOL) 500 MG tablet Take 1,500 mg by mouth every 6 (six) hours as needed for mild pain.   Yes Historical Provider, MD  HYDROcodone-acetaminophen (NORCO/VICODIN) 5-325 MG tablet Take 1 tablet by mouth every 6 (six) hours as needed for moderate pain.   Yes Historical Provider, MD    Family History History reviewed. No pertinent family history.  Social History Social History  Substance Use Topics  . Smoking status: Current Every Day Smoker    Packs/day: 1.00    Types: Cigarettes  . Smokeless tobacco: Never Used  . Alcohol use No     Allergies   Review of patient's allergies indicates no known allergies.   Review of Systems Review of Systems  Constitutional: Positive for fatigue.  Respiratory: Positive for cough and  shortness of breath.   Musculoskeletal: Positive for back pain.  Neurological: Positive for light-headedness.       Sensation of tingling.  All other systems reviewed and are negative.    Physical Exam Updated Vital Signs BP 113/89   Pulse 80   Temp 98 F (36.7 C) (Oral)   Resp 26   Ht  (1.575 m)   Wt 81.2 kg   LMP 02/14/2012   SpO2 95%   BMI 32.74 kg/m   Physical Exam  Constitutional: She appears well-developed and well-nourished. No distress.  HENT:  Head: Normocephalic and atraumatic.  Right Ear: External ear normal.  Left Ear: External ear normal.  Eyes: Conjunctivae are normal. Right eye exhibits no discharge. Left eye exhibits no discharge. No scleral icterus.  Neck: Neck supple. No tracheal deviation present.    Cardiovascular: Normal rate, regular rhythm and intact distal pulses.   Pulmonary/Chest: Effort normal and breath sounds normal. No stridor. No respiratory distress. She has no wheezes. She has no rales.  Abdominal: Soft. Bowel sounds are normal. She exhibits no distension. There is no tenderness. There is no rebound and no guarding.  Musculoskeletal: She exhibits no edema or tenderness.  Neurological: She is alert. She has normal strength. No cranial nerve deficit (no facial droop, extraocular movements intact, no slurred speech) or sensory deficit. She exhibits normal muscle tone. She displays no seizure activity. Coordination normal.  Skin: Skin is warm and dry. No rash noted.  Psychiatric: She has a normal mood and affect.  Nursing note and vitals reviewed.    ED Treatments / Results  Labs (all labs ordered are listed, but only abnormal results are displayed) Labs Reviewed  BASIC METABOLIC PANEL - Abnormal; Notable for the following:       Result Value   Potassium 3.4 (*)    Glucose, Bld 189 (*)    Creatinine, Ser 1.03 (*)    Calcium 8.4 (*)    All other components within normal limits  CBC - Abnormal; Notable for the following:    WBC 12.3 (*)    RBC 5.66 (*)    Hemoglobin 17.0 (*)    HCT 49.0 (*)    All other components within normal limits  CBG MONITORING, ED - Abnormal; Notable for the following:    Glucose-Capillary 212 (*)    All other components within normal limits  URINALYSIS, ROUTINE W REFLEX MICROSCOPIC (NOT AT [facility])  TROPONIN I    EKG  EKG Interpretation None       Radiology No results found.  Procedures Procedures (including critical care time)  Medications Ordered in ED Medications - No data to display   Initial Impression / Assessment and Plan / ED Course  I have reviewed the triage vital signs and the nursing notes.  Pertinent labs & imaging results that were available during my care of the patient were reviewed by me and considered in my  medical decision making (see chart for details).  Clinical Course    *I have reviewed nursing notes, vital signs, and all appropriate lab and imaging results for this patient.**  Final Clinical Impressions(s) / ED Diagnoses  Vital signs within normal limits. Urinalysis reveals a clear yellow specimen with an elevated specific gravity of greater than 1.030, trace of hemoglobin, moderate bilirubin on. There are noted some Hylan casts present on the microscopic. Potassium is slightly low at 3.4, the glucose is elevated at 189. The creatinine is also slightly above the normal at 1.03. The white  blood cell count is elevated at 12,300, the hemoglobin is 17, the hematocrit is 49. The troponin is negative for acute event at less than 0.03. The chest x-ray shows no acute cardiopulmonary process.   Discussed the findings with the patient in terms she understands. She agrees to hydrate at home. She agrees to return to the ED if any changes or problem. Pt also will seek a primary MD or see MD at the free clinic for recheck and for eval of glucose elevation.   Final diagnoses:  None    New Prescriptions New Prescriptions   No medications on file     Ivery QualeHobson Adelita Hone, PA-C 09/19/15 0028    Eber HongBrian Miller, MD 09/19/15 1315

## 2015-09-18 NOTE — ED Notes (Signed)
Pt returns from xray,  

## 2015-09-18 NOTE — ED Notes (Signed)
Pt and family updated on plan of care, denies any complaints, pt assisted to restroom, tolerated well,

## 2015-09-19 NOTE — Discharge Instructions (Signed)
YOur glucose was elevated at 212 to 189 tonight. Please call Dr Janna ArchonDiego to seek a primary MD, or [facility]. Please increase water, juices, and gatorades.

## 2015-09-19 NOTE — ED Notes (Signed)
Pt and family updated, pending disposition,

## 2015-09-20 LAB — URINE CULTURE: Culture: 2000 — AB

## 2015-09-27 ENCOUNTER — Emergency Department (HOSPITAL_COMMUNITY): Payer: No Typology Code available for payment source

## 2015-09-27 ENCOUNTER — Encounter (HOSPITAL_COMMUNITY): Payer: Self-pay | Admitting: *Deleted

## 2015-09-27 ENCOUNTER — Emergency Department (HOSPITAL_COMMUNITY)
Admission: EM | Admit: 2015-09-27 | Discharge: 2015-09-27 | Disposition: A | Discharge status: Home / Self Care | Payer: No Typology Code available for payment source | Attending: Emergency Medicine | Admitting: Emergency Medicine

## 2015-09-27 DIAGNOSIS — S39012A Strain of muscle, fascia and tendon of lower back, initial encounter: Secondary | ICD-10-CM | POA: Insufficient documentation

## 2015-09-27 DIAGNOSIS — Y999 Unspecified external cause status: Secondary | ICD-10-CM | POA: Insufficient documentation

## 2015-09-27 DIAGNOSIS — F17210 Nicotine dependence, cigarettes, uncomplicated: Secondary | ICD-10-CM | POA: Diagnosis not present

## 2015-09-27 DIAGNOSIS — Y939 Activity, unspecified: Secondary | ICD-10-CM | POA: Insufficient documentation

## 2015-09-27 DIAGNOSIS — Y92410 Unspecified street and highway as the place of occurrence of the external cause: Secondary | ICD-10-CM | POA: Insufficient documentation

## 2015-09-27 DIAGNOSIS — S3992XA Unspecified injury of lower back, initial encounter: Secondary | ICD-10-CM | POA: Diagnosis present

## 2015-09-27 MED ORDER — HYDROCODONE-ACETAMINOPHEN 5-325 MG PO TABS: ORAL_TABLET | ORAL | 0 refills | Status: DC

## 2015-09-27 MED ORDER — CYCLOBENZAPRINE HCL 10 MG PO TABS: 10.0000 mg | ORAL_TABLET | Freq: Three times a day (TID) | ORAL | 0 refills | Status: DC | PRN

## 2015-09-27 MED ORDER — IBUPROFEN 800 MG PO TABS: 800.0000 mg | ORAL_TABLET | Freq: Three times a day (TID) | ORAL | 0 refills | Status: DC
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
## 2015-09-27 NOTE — Discharge Instructions (Signed)
Apply ice packs on/off to your back.  Follow-up with your doctor or with the orthopedic doctor listed.
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
## 2016-12-11 ENCOUNTER — Encounter (HOSPITAL_COMMUNITY): Payer: Self-pay | Admitting: Emergency Medicine

## 2016-12-11 ENCOUNTER — Emergency Department (HOSPITAL_COMMUNITY)
Admission: EM | Admit: 2016-12-11 | Discharge: 2016-12-11 | Disposition: A | Discharge status: Home / Self Care | Payer: Medicaid Other | Attending: Emergency Medicine | Admitting: Emergency Medicine

## 2016-12-11 DIAGNOSIS — Z791 Long term (current) use of non-steroidal anti-inflammatories (NSAID): Secondary | ICD-10-CM | POA: Diagnosis not present

## 2016-12-11 DIAGNOSIS — F17210 Nicotine dependence, cigarettes, uncomplicated: Secondary | ICD-10-CM | POA: Diagnosis not present

## 2016-12-11 DIAGNOSIS — Z9049 Acquired absence of other specified parts of digestive tract: Secondary | ICD-10-CM | POA: Insufficient documentation

## 2016-12-11 DIAGNOSIS — M70832 Other soft tissue disorders related to use, overuse and pressure, left forearm: Secondary | ICD-10-CM | POA: Diagnosis not present

## 2016-12-11 DIAGNOSIS — M778 Other enthesopathies, not elsewhere classified: Secondary | ICD-10-CM

## 2016-12-11 DIAGNOSIS — Y9389 Activity, other specified: Secondary | ICD-10-CM | POA: Insufficient documentation

## 2016-12-11 DIAGNOSIS — M25522 Pain in left elbow: Secondary | ICD-10-CM | POA: Diagnosis present

## 2016-12-11 MED ORDER — DEXAMETHASONE SODIUM PHOSPHATE 10 MG/ML IJ SOLN
10.0000 mg | Freq: Once | INTRAMUSCULAR | Status: AC
  Administered 2016-12-11: 10 mg via INTRAMUSCULAR
  Filled 2016-12-11: qty 1

## 2016-12-11 MED ORDER — INDOMETHACIN 25 MG PO CAPS
25.0000 mg | ORAL_CAPSULE | Freq: Once | ORAL | Status: AC
  Administered 2016-12-11: 25 mg via ORAL
  Filled 2016-12-11: qty 1

## 2016-12-11 MED ORDER — TRAMADOL HCL 50 MG PO TABS: 50.0000 mg | ORAL_TABLET | Freq: Four times a day (QID) | ORAL | 0 refills | Status: DC | PRN

## 2016-12-11 MED ORDER — IBUPROFEN 600 MG PO TABS: 600.0000 mg | ORAL_TABLET | Freq: Four times a day (QID) | ORAL | 0 refills | Status: DC

## 2016-12-11 MED ORDER — PROMETHAZINE HCL 12.5 MG PO TABS
12.5000 mg | ORAL_TABLET | Freq: Once | ORAL | Status: AC
  Administered 2016-12-11: 12.5 mg via ORAL
  Filled 2016-12-11: qty 1

## 2016-12-11 MED ORDER — TRAMADOL HCL 50 MG PO TABS
100.0000 mg | ORAL_TABLET | Freq: Once | ORAL | Status: AC
  Administered 2016-12-11: 100 mg via ORAL
  Filled 2016-12-11: qty 2

## 2016-12-11 NOTE — Discharge Instructions (Signed)
Your vital signs within normal limits.  Your examination suggest tendinitis of the left elbow area.  Please use your sling over the next 3 or 4 days.  Apply heat to your elbow area.  Use ibuprofen with breakfast, lunch, dinner, and at bedtime.  May use Ultram for more severe pain.This medication may cause drowsiness. Please do not drink, drive, or participate in activity that requires concentration while taking this medication.  See Dr. Romeo AppleHarrison for orthopedic evaluation if not improving.

## 2016-12-11 NOTE — ED Provider Notes (Signed)
[facility] EMERGENCY DEPARTMENT Provider Note   CSN: [id number] Arrival date & time: 12/11/16  1809     History   Chief Complaint Chief Complaint  Patient presents with  . Arm Pain    HPI Christy Hopkins is a 51 y.o. female.  Patient is a 51 year old female who presents to the emergency department with a complaint of left elbow pain.  The patient states this problem started on yesterday.  She does not recall any direct injury to the elbow.  She states that she played some basketball with her children 2 days ago.  She did not have any pain after playing.  She states she has been doing her usual activities around the house, but no excessive straining, lifting, pushing, or pulling.  She has not had this problem before.  She has not had any operations or procedures involving the elbow.  She states the pain is intense, and she just does not feel that she can take it any longer.  She is tried heat and ice without improvement.   The history is provided by the patient.    Past Medical History:  Diagnosis Date  . Chronic back pain     There are no active problems to display for this patient.   Past Surgical History:  Procedure Laterality Date  . BACK SURGERY    . CHOLECYSTECTOMY    . TONSILLECTOMY      OB History    Gravida Para Term Preterm AB Living             3   SAB TAB Ectopic Multiple Live Births                   Home Medications    Prior to Admission medications   Medication Sig Start Date End Date Taking? Authorizing Provider  cyclobenzaprine (FLEXERIL) 10 MG tablet Take 1 tablet (10 mg total) by mouth 3 (three) times daily as needed. 09/27/15   Triplett, Tammy, PA-C  HYDROcodone-acetaminophen (NORCO/VICODIN) 5-325 MG tablet Take one tab po q 4-6 hrs prn pain 09/27/15   Triplett, Tammy, PA-C  ibuprofen (ADVIL,MOTRIN) 800 MG tablet Take 1 tablet (800 mg total) by mouth 3 (three) times daily. 09/27/15   Pauline Aus, PA-C    Family History History  reviewed. No pertinent family history.  Social History Social History  Substance Use Topics  . Smoking status: Current Every Day Smoker    Packs/day: 1.00    Types: Cigarettes  . Smokeless tobacco: Never Used  . Alcohol use No     Allergies   Patient has no known allergies.   Review of Systems Review of Systems  Constitutional: Negative for activity change.       All ROS Neg except as noted in HPI  HENT: Negative for nosebleeds.   Eyes: Negative for photophobia and discharge.  Respiratory: Negative for cough, shortness of breath and wheezing.   Cardiovascular: Negative for chest pain and palpitations.  Gastrointestinal: Negative for abdominal pain and blood in stool.  Genitourinary: Negative for dysuria, frequency and hematuria.  Musculoskeletal: Positive for arthralgias. Negative for back pain and neck pain.  Skin: Negative.   Neurological: Negative for dizziness, seizures and speech difficulty.  Psychiatric/Behavioral: Negative for confusion and hallucinations.     Physical Exam Updated Vital Signs BP 116/81 (BP Location: Right Arm)   Pulse 87   Temp 97.8 F (36.6 C) (Oral)   Resp 18   Ht 5\' 2"  (1.575 m)   Wt 81.6  kg (180 lb)   LMP 02/14/2012   SpO2 94%   BMI 32.92 kg/m   Physical Exam  Constitutional: She is oriented to person, place, and time. She appears well-developed and well-nourished.  Non-toxic appearance.  HENT:  Head: Normocephalic.  Right Ear: Tympanic membrane and external ear normal.  Left Ear: Tympanic membrane and external ear normal.  Eyes: Pupils are equal, round, and reactive to light. EOM and lids are normal.  Neck: Normal range of motion. Neck supple. Carotid bruit is not present.  Cardiovascular: Normal rate, regular rhythm, normal heart sounds, intact distal pulses and normal pulses.   Pulmonary/Chest: Breath sounds normal. No respiratory distress.  Abdominal: Soft. Bowel sounds are normal. There is no tenderness. There is no guarding.   Musculoskeletal: Normal range of motion.  There is full range of motion of the left shoulder.  There is pain with flexion and extension of the left elbow.  There is pain with pronation and supination on the left.  The radial pulses 2+.  The capillary refill is less than 2 seconds.  Lymphadenopathy:       Head (right side): No submandibular adenopathy present.       Head (left side): No submandibular adenopathy present.    She has no cervical adenopathy.  Neurological: She is alert and oriented to person, place, and time. She has normal strength. No cranial nerve deficit or sensory deficit.  Skin: Skin is warm and dry.  Psychiatric: She has a normal mood and affect. Her speech is normal.  Nursing note and vitals reviewed.    ED Treatments / Results  Labs (all labs ordered are listed, but only abnormal results are displayed) Labs Reviewed - No data to display  EKG  EKG Interpretation None       Radiology No results found.  Procedures Procedures (including critical care time)  Medications Ordered in ED Medications - No data to display   Initial Impression / Assessment and Plan / ED Course  I have reviewed the triage vital signs and the nursing notes.  Pertinent labs & imaging results that were available during my care of the patient were reviewed by me and considered in my medical decision making (see chart for details).       Final Clinical Impressions(s) / ED Diagnoses MDM Vital signs within normal limits.  The examination favors tendinitis involving the elbow.  There are no neurovascular deficits appreciated of the upper right or left extremity.  The patient is placed in a sling.  Prescription for ibuprofen and Ultram given to the patient.  The patient is to follow-up with Dr. Romeo AppleHarrison for orthopedic evaluation if not improving.   Final diagnoses:  Tendonitis of elbow, left    New Prescriptions New Prescriptions   IBUPROFEN (ADVIL,MOTRIN) 600 MG TABLET     Take 1 tablet (600 mg total) by mouth 4 (four) times daily.   TRAMADOL (ULTRAM) 50 MG TABLET    Take 1 tablet (50 mg total) by mouth every 6 (six) hours as needed.     Ivery QualeBryant, Zeinab Rodwell, PA-C 12/11/16 Carlis [id number]    Linwood DibblesKnapp, Jon, MD 12/12/16 [phone]

## 2016-12-11 NOTE — ED Triage Notes (Signed)
Pt reports left elbow pain since yesterday with no injury.  No swelling or redness noted.

## 2016-12-11 NOTE — ED Notes (Signed)
Patient waiting on med time.

## 2017-01-23 ENCOUNTER — Emergency Department (HOSPITAL_COMMUNITY)
Admission: EM | Admit: 2017-01-23 | Discharge: 2017-01-23 | Disposition: A | Discharge status: Home / Self Care | Payer: Medicaid Other | Attending: Emergency Medicine | Admitting: Emergency Medicine

## 2017-01-23 ENCOUNTER — Encounter (HOSPITAL_COMMUNITY): Payer: Self-pay | Admitting: Emergency Medicine

## 2017-01-23 ENCOUNTER — Other Ambulatory Visit: Payer: Self-pay

## 2017-01-23 DIAGNOSIS — M5431 Sciatica, right side: Secondary | ICD-10-CM | POA: Diagnosis not present

## 2017-01-23 DIAGNOSIS — Z79899 Other long term (current) drug therapy: Secondary | ICD-10-CM | POA: Insufficient documentation

## 2017-01-23 DIAGNOSIS — F17210 Nicotine dependence, cigarettes, uncomplicated: Secondary | ICD-10-CM | POA: Insufficient documentation

## 2017-01-23 DIAGNOSIS — M545 Low back pain: Secondary | ICD-10-CM | POA: Diagnosis present

## 2017-01-23 MED ORDER — HYDROCODONE-ACETAMINOPHEN 5-325 MG PO TABS
1.0000 | ORAL_TABLET | Freq: Once | ORAL | Status: AC
  Administered 2017-01-23: 1 via ORAL
  Filled 2017-01-23: qty 1

## 2017-01-23 MED ORDER — PREDNISONE 10 MG PO TABS: ORAL_TABLET | ORAL | 0 refills | Status: DC

## 2017-01-23 MED ORDER — CYCLOBENZAPRINE HCL 5 MG PO TABS: 5.0000 mg | ORAL_TABLET | Freq: Three times a day (TID) | ORAL | 0 refills | Status: DC | PRN

## 2017-01-23 MED ORDER — HYDROCODONE-ACETAMINOPHEN 5-325 MG PO TABS: 1.0000 | ORAL_TABLET | ORAL | 0 refills | Status: DC | PRN

## 2017-01-23 MED ORDER — PREDNISONE 50 MG PO TABS
60.0000 mg | ORAL_TABLET | Freq: Once | ORAL | Status: AC
  Administered 2017-01-23: 19:00:00 60 mg via ORAL
  Filled 2017-01-23: qty 1

## 2017-01-23 NOTE — Discharge Instructions (Signed)
Take your next dose of prednisone tomorrow evening.  Use the the other medicines as directed.  Do not drive within 4 hours of taking hydrocodone as this will make you drowsy.  Avoid lifting,  Bending,  Twisting or any other activity that worsens your pain over the next week.  Apply heat to your back 20 minutes several times daily.  You should get rechecked if your symptoms are not better over the next 5 days,  Or you develop increased pain,  Weakness in your leg(s) or loss of bladder or bowel function - these are symptoms of a worse injury.

## 2017-01-23 NOTE — ED Provider Notes (Signed)
[facility] EMERGENCY DEPARTMENT Provider Note   CSN: [id number] Arrival date & time: 01/23/17  1725     History   Chief Complaint Chief Complaint  Patient presents with  . Back Pain    HPI Samuel Germanyamela D Inglis is a 51 y.o. female with a history of chronic intermittent low back pain and a distant history of lumbar surgery presenting with an acute episode of pain which started 3 days ago.  She denies any injuries, but possibly "jarred" her back when walking through snow and ice this past week.  She has radiation of pain into her right lateral ankle.  There has been no weakness or numbness in the lower extremities and no urinary or bowel retention or incontinence.  Patient does not have a history of cancer or IVDU.  The patient has tried ibuprofen and tramadol without significant relief of symptoms.   HPI  Past Medical History:  Diagnosis Date  . Chronic back pain     There are no active problems to display for this patient.   Past Surgical History:  Procedure Laterality Date  . BACK SURGERY    . CHOLECYSTECTOMY    . TONSILLECTOMY      OB History    Gravida Para Term Preterm AB Living             3   SAB TAB Ectopic Multiple Live Births                   Home Medications    Prior to Admission medications   Medication Sig Start Date End Date Taking? Authorizing Provider  cyclobenzaprine (FLEXERIL) 5 MG tablet Take 1 tablet (5 mg total) by mouth 3 (three) times daily as needed for muscle spasms. 01/23/17   Burgess AmorIdol, Mariha Sleeper, PA-C  HYDROcodone-acetaminophen (NORCO/VICODIN) 5-325 MG tablet Take 1 tablet by mouth every 4 (four) hours as needed. 01/23/17   Burgess AmorIdol, Bryn Perkin, PA-C  ibuprofen (ADVIL,MOTRIN) 600 MG tablet Take 1 tablet (600 mg total) by mouth 4 (four) times daily. 12/11/16   Ivery QualeBryant, Hobson, PA-C  predniSONE (DELTASONE) 10 MG tablet Take 6 tablets day one, 5 tablets day two, 4 tablets day three, 3 tablets day four, 2 tablets day five, then 1 tablet day six 01/23/17   Bess Saltzman,  Raynelle FanningJulie, PA-C  traMADol (ULTRAM) 50 MG tablet Take 1 tablet (50 mg total) by mouth every 6 (six) hours as needed. 12/11/16   Ivery QualeBryant, Hobson, PA-C    Family History No family history on file.  Social History Social History   Tobacco Use  . Smoking status: Current Every Day Smoker    Packs/day: 1.00    Types: Cigarettes  . Smokeless tobacco: Never Used  Substance Use Topics  . Alcohol use: No  . Drug use: No     Allergies   Patient has no known allergies.   Review of Systems Review of Systems  Constitutional: Negative for fever.  Respiratory: Negative for shortness of breath.   Cardiovascular: Negative for chest pain and leg swelling.  Gastrointestinal: Negative for abdominal distention, abdominal pain and constipation.  Genitourinary: Negative for difficulty urinating, dysuria, flank pain, frequency and urgency.  Musculoskeletal: Positive for back pain. Negative for gait problem and joint swelling.  Skin: Negative for rash.  Neurological: Negative for weakness and numbness.     Physical Exam Updated Vital Signs BP (!) 144/82   Pulse 85   Temp 98.5 F (36.9 C) (Oral)   Resp 18   Ht 5\' 3"  (1.6 m)  Wt 81.6 kg (180 lb)   LMP 02/14/2012   SpO2 95%   BMI 31.89 kg/m   Physical Exam  Constitutional: She appears well-developed and well-nourished.  HENT:  Head: Normocephalic.  Eyes: Conjunctivae are normal.  Neck: Normal range of motion. Neck supple.  Cardiovascular: Normal rate and intact distal pulses.  Pedal pulses normal.  Pulmonary/Chest: Effort normal.  Abdominal: Soft. Bowel sounds are normal. She exhibits no distension and no mass.  Musculoskeletal: Normal range of motion. She exhibits no edema.       Lumbar back: She exhibits tenderness. She exhibits no swelling, no edema and no spasm.  Radiation of pain elicited with deep palpation at the right SI joint.  Neurological: She is alert. She has normal strength. She displays no atrophy and no tremor. No  sensory deficit. Gait normal.  Reflex Scores:      Patellar reflexes are 2+ on the right side and 2+ on the left side.      Achilles reflexes are 2+ on the right side and 2+ on the left side. No strength deficit noted in hip and knee flexor and extensor muscle groups.  Ankle flexion and extension intact.  Skin: Skin is warm and dry.  Psychiatric: She has a normal mood and affect.  Nursing note and vitals reviewed.    ED Treatments / Results  Labs (all labs ordered are listed, but only abnormal results are displayed) Labs Reviewed - No data to display  EKG  EKG Interpretation None       Radiology No results found.  Procedures Procedures (including critical care time)  Medications Ordered in ED Medications  predniSONE (DELTASONE) tablet 60 mg (60 mg Oral Given 01/23/17 1911)  HYDROcodone-acetaminophen (NORCO/VICODIN) 5-325 MG per tablet 1 tablet (1 tablet Oral Given 01/23/17 1911)     Initial Impression / Assessment and Plan / ED Course  I have reviewed the triage vital signs and the nursing notes.  Pertinent labs & imaging results that were available during my care of the patient were reviewed by me and considered in my medical decision making (see chart for details).     No neuro deficit on exam or by history to suggest emergent or surgical presentation.  Also discussed worsened sx that should prompt immediate re-evaluation including distal weakness, bowel/bladder retention/incontinence.        Final Clinical Impressions(s) / ED Diagnoses   Final diagnoses:  Sciatica of right side    ED Discharge Orders        Ordered    predniSONE (DELTASONE) 10 MG tablet     01/23/17 1913    HYDROcodone-acetaminophen (NORCO/VICODIN) 5-325 MG tablet  Every 4 hours PRN     01/23/17 1913    cyclobenzaprine (FLEXERIL) 5 MG tablet  3 times daily PRN     01/23/17 1913       Victoriano Laindol, Isabella Roemmich, PA-C 01/23/17 1914    Doug SouJacubowitz, Sam, MD 01/23/17 1946

## 2017-01-23 NOTE — ED Triage Notes (Signed)
Patient c/o lower back pain that radiates into right leg. Denies any injury. CNS intact. Denies any complications with urination or BMs. Per patient slight in right buttocks.

## 2017-08-17 ENCOUNTER — Emergency Department (HOSPITAL_COMMUNITY): Payer: Self-pay

## 2017-08-17 ENCOUNTER — Encounter (HOSPITAL_COMMUNITY): Payer: Self-pay | Admitting: Emergency Medicine

## 2017-08-17 ENCOUNTER — Emergency Department (HOSPITAL_COMMUNITY)
Admission: EM | Admit: 2017-08-17 | Discharge: 2017-08-17 | Disposition: A | Discharge status: Home / Self Care | Payer: Self-pay | Attending: Emergency Medicine | Admitting: Emergency Medicine

## 2017-08-17 ENCOUNTER — Other Ambulatory Visit: Payer: Self-pay

## 2017-08-17 DIAGNOSIS — S022XXA Fracture of nasal bones, initial encounter for closed fracture: Secondary | ICD-10-CM | POA: Insufficient documentation

## 2017-08-17 DIAGNOSIS — Y9389 Activity, other specified: Secondary | ICD-10-CM | POA: Insufficient documentation

## 2017-08-17 DIAGNOSIS — F17210 Nicotine dependence, cigarettes, uncomplicated: Secondary | ICD-10-CM | POA: Insufficient documentation

## 2017-08-17 DIAGNOSIS — Y999 Unspecified external cause status: Secondary | ICD-10-CM | POA: Insufficient documentation

## 2017-08-17 DIAGNOSIS — Z79899 Other long term (current) drug therapy: Secondary | ICD-10-CM | POA: Insufficient documentation

## 2017-08-17 DIAGNOSIS — Y92410 Unspecified street and highway as the place of occurrence of the external cause: Secondary | ICD-10-CM | POA: Insufficient documentation

## 2017-08-17 DIAGNOSIS — S2241XA Multiple fractures of ribs, right side, initial encounter for closed fracture: Secondary | ICD-10-CM | POA: Insufficient documentation

## 2017-08-17 LAB — URINALYSIS, ROUTINE W REFLEX MICROSCOPIC
Bilirubin Urine: NEGATIVE
Glucose, UA: NEGATIVE mg/dL
Ketones, ur: NEGATIVE mg/dL
Nitrite: NEGATIVE
Protein, ur: 30 mg/dL — AB
RBC / HPF: >50 RBC/hpf — ABNORMAL HIGH (ref 0–5)
Specific Gravity, Urine: 1.011 (ref 1.005–1.030)
pH: 7 (ref 5.0–8.0)

## 2017-08-17 LAB — CBC
HCT: 43.7 % (ref 36.0–46.0)
Hemoglobin: 14.8 g/dL (ref 12.0–15.0)
MCH: 29.9 pg (ref 26.0–34.0)
MCHC: 33.9 g/dL (ref 30.0–36.0)
MCV: 88.3 fL (ref 78.0–100.0)
Platelets: 316 10*3/uL (ref 150–400)
RBC: 4.95 MIL/uL (ref 3.87–5.11)
RDW: 13.6 % (ref 11.5–15.5)
WBC: 23.3 10*3/uL — ABNORMAL HIGH (ref 4.0–10.5)

## 2017-08-17 LAB — BASIC METABOLIC PANEL
Anion gap: 8 (ref 5–15)
BUN: 7 mg/dL (ref 6–20)
CO2: 28 mmol/L (ref 22–32)
Calcium: 8.4 mg/dL — ABNORMAL LOW (ref 8.9–10.3)
Chloride: 105 mmol/L (ref 98–111)
Creatinine, Ser: 0.92 mg/dL (ref 0.44–1.00)
GFR calc Af Amer: >60 mL/min (ref >60)
GFR calc non Af Amer: >60 mL/min (ref >60)
Glucose, Bld: 130 mg/dL — ABNORMAL HIGH (ref 70–99)
Potassium: 3.2 mmol/L — ABNORMAL LOW (ref 3.5–5.1)
Sodium: 141 mmol/L (ref 135–145)

## 2017-08-17 LAB — CBG MONITORING, ED: Glucose-Capillary: 122 mg/dL — ABNORMAL HIGH (ref 70–99)

## 2017-08-17 MED ORDER — OXYCODONE-ACETAMINOPHEN 5-325 MG PO TABS: 1.0000 | ORAL_TABLET | ORAL | 0 refills | Status: AC | PRN

## 2017-08-17 MED ORDER — MORPHINE SULFATE (PF) 4 MG/ML IV SOLN
6.0000 mg | Freq: Once | INTRAVENOUS | Status: AC
  Administered 2017-08-17: 6 mg via INTRAVENOUS
  Filled 2017-08-17: qty 2

## 2017-08-17 MED ORDER — SODIUM CHLORIDE 0.9 % IV BOLUS
1000.0000 mL | Freq: Once | INTRAVENOUS | Status: AC
  Administered 2017-08-17: 1000 mL via INTRAVENOUS

## 2017-08-17 MED ORDER — IOPAMIDOL (ISOVUE-300) INJECTION 61%
75.0000 mL | Freq: Once | INTRAVENOUS | Status: AC | PRN
  Administered 2017-08-17: 75 mL via INTRAVENOUS

## 2017-08-17 MED ORDER — IBUPROFEN 400 MG PO TABS
600.0000 mg | ORAL_TABLET | Freq: Once | ORAL | Status: AC
  Administered 2017-08-17: 600 mg via ORAL
  Filled 2017-08-17: qty 2

## 2017-08-17 MED ORDER — BACITRACIN ZINC 500 UNIT/GM EX OINT
TOPICAL_OINTMENT | CUTANEOUS | Status: AC
  Administered 2017-08-17: 1
  Filled 2017-08-17: qty 0.9

## 2017-08-17 MED ORDER — LORAZEPAM 2 MG/ML IJ SOLN
0.5000 mg | Freq: Once | INTRAMUSCULAR | Status: AC
  Administered 2017-08-17: 0.5 mg via INTRAVENOUS
  Filled 2017-08-17: qty 1
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
## 2017-08-17 NOTE — ED Notes (Signed)
IS teaching currently with Resp. Waiting on ride home.
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
## 2017-08-17 NOTE — ED Notes (Signed)
Skin tear to right knee Bruising to chest C Collar in place  Alert and oriented

## 2017-08-17 NOTE — ED Notes (Signed)
Respiratory paged for incentive spirometer. 

## 2017-08-17 NOTE — Discharge Instructions (Signed)
Use incentive spirometer every 30 minutes to an hour while you are awake for the next 1-2 weeks. People naturally tend to breath more shallow with rib fractures because of pain but this can lead to complications such as pneumonia. The incentive spirometer encourages you to keep your lungs open. Take prescribed pain medicine as needed. Take 600 mg of ibuprofen every 6 hours as needed for pain as well. You'll get better pain relief taking them both.   Your nasal fracture is non-displaced. This means it broken but the bones are still aligned well. This should heal w/o complication. The deformity you are seeing is likely just from the soft tissue swelling. If you still notice a deformity after the swelling has decreased (can take up to a week) or are having difficulty with breathing through your nose then follow-up with ENT.

## 2017-08-19 LAB — URINE CULTURE

## 2017-08-21 ENCOUNTER — Emergency Department (HOSPITAL_COMMUNITY): Payer: No Typology Code available for payment source

## 2017-08-21 ENCOUNTER — Other Ambulatory Visit: Payer: Self-pay

## 2017-08-21 ENCOUNTER — Emergency Department (HOSPITAL_COMMUNITY)
Admission: EM | Admit: 2017-08-21 | Discharge: 2017-08-21 | Disposition: A | Discharge status: Home / Self Care | Payer: No Typology Code available for payment source | Attending: Emergency Medicine | Admitting: Emergency Medicine

## 2017-08-21 ENCOUNTER — Encounter (HOSPITAL_COMMUNITY): Payer: Self-pay | Admitting: Emergency Medicine

## 2017-08-21 DIAGNOSIS — F17210 Nicotine dependence, cigarettes, uncomplicated: Secondary | ICD-10-CM | POA: Insufficient documentation

## 2017-08-21 DIAGNOSIS — S2241XD Multiple fractures of ribs, right side, subsequent encounter for fracture with routine healing: Secondary | ICD-10-CM | POA: Insufficient documentation

## 2017-08-21 DIAGNOSIS — S40022A Contusion of left upper arm, initial encounter: Secondary | ICD-10-CM

## 2017-08-21 DIAGNOSIS — S022XXD Fracture of nasal bones, subsequent encounter for fracture with routine healing: Secondary | ICD-10-CM | POA: Insufficient documentation

## 2017-08-21 DIAGNOSIS — S301XXD Contusion of abdominal wall, subsequent encounter: Secondary | ICD-10-CM | POA: Diagnosis not present
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
## 2018-01-14 ENCOUNTER — Other Ambulatory Visit: Payer: Self-pay

## 2018-01-14 ENCOUNTER — Emergency Department (HOSPITAL_COMMUNITY): Payer: Medicaid Other

## 2018-01-14 ENCOUNTER — Encounter (HOSPITAL_COMMUNITY): Payer: Self-pay | Admitting: Emergency Medicine

## 2018-01-14 ENCOUNTER — Emergency Department (HOSPITAL_COMMUNITY)
Admission: EM | Admit: 2018-01-14 | Discharge: 2018-01-15 | Disposition: A | Discharge status: Home / Self Care | Payer: Medicaid Other | Attending: Emergency Medicine | Admitting: Emergency Medicine

## 2018-01-14 DIAGNOSIS — R519 Headache, unspecified: Secondary | ICD-10-CM

## 2018-01-14 DIAGNOSIS — R51 Headache: Secondary | ICD-10-CM | POA: Insufficient documentation

## 2018-01-14 DIAGNOSIS — F17210 Nicotine dependence, cigarettes, uncomplicated: Secondary | ICD-10-CM | POA: Insufficient documentation

## 2018-01-14 LAB — BASIC METABOLIC PANEL
Anion gap: 7 (ref 5–15)
BUN: 11 mg/dL (ref 6–20)
CALCIUM: 8.8 mg/dL — AB (ref 8.9–10.3)
CO2: 26 mmol/L (ref 22–32)
Chloride: 103 mmol/L (ref 98–111)
Creatinine, Ser: 0.83 mg/dL (ref 0.44–1.00)
GFR calc Af Amer: >60 mL/min (ref >60)
GFR calc non Af Amer: >60 mL/min (ref >60)
Glucose, Bld: 93 mg/dL (ref 70–99)
Potassium: 3.3 mmol/L — ABNORMAL LOW (ref 3.5–5.1)
Sodium: 136 mmol/L (ref 135–145)

## 2018-01-14 LAB — CBC
HCT: 48.1 % — ABNORMAL HIGH (ref 36.0–46.0)
Hemoglobin: 15.7 g/dL — ABNORMAL HIGH (ref 12.0–15.0)
MCH: 28.8 pg (ref 26.0–34.0)
MCHC: 32.6 g/dL (ref 30.0–36.0)
MCV: 88.1 fL (ref 80.0–100.0)
PLATELETS: 294 10*3/uL (ref 150–400)
RBC: 5.46 MIL/uL — ABNORMAL HIGH (ref 3.87–5.11)
RDW: 13.6 % (ref 11.5–15.5)
WBC: 11.4 10*3/uL — ABNORMAL HIGH (ref 4.0–10.5)
nRBC: 0 % (ref 0.0–0.2)

## 2018-01-14 MED ORDER — METOCLOPRAMIDE HCL 5 MG/ML IJ SOLN
10.0000 mg | Freq: Once | INTRAMUSCULAR | Status: AC
  Administered 2018-01-14: 10 mg via INTRAVENOUS
  Filled 2018-01-14: qty 2

## 2018-01-14 MED ORDER — SODIUM CHLORIDE 0.9 % IV BOLUS
1000.0000 mL | Freq: Once | INTRAVENOUS | Status: AC
  Administered 2018-01-14: 1000 mL via INTRAVENOUS

## 2018-01-14 MED ORDER — DIPHENHYDRAMINE HCL 50 MG/ML IJ SOLN
25.0000 mg | Freq: Once | INTRAMUSCULAR | Status: AC
  Administered 2018-01-14: 25 mg via INTRAVENOUS
  Filled 2018-01-14: qty 1

## 2018-01-14 MED ORDER — DEXAMETHASONE SODIUM PHOSPHATE 4 MG/ML IJ SOLN
10.0000 mg | Freq: Once | INTRAMUSCULAR | Status: AC
  Administered 2018-01-14: 10 mg via INTRAVENOUS
  Filled 2018-01-14: qty 3

## 2018-01-14 NOTE — ED Notes (Signed)
Patient is disoriented from medications at this time and has no ride home. Patient's daughter contacted but no answer. Patient resting in her room at this time.

## 2018-01-14 NOTE — ED Provider Notes (Signed)
[facility] EMERGENCY DEPARTMENT Provider Note   CSN: [id number] Arrival date & time: 01/14/18  1846     History   Chief Complaint Chief Complaint  Patient presents with  . Headache    HPI Christy Hopkins is a 52 y.o. female.  Patient with the complaint of headache for 2 days.  Frontal in nature no sinus congestion no sinus pain.  Associated with nausea no vomiting a little bit of blurred vision.  No fevers no flulike symptoms no upper respiratory symptoms.  No neck stiffness.  No history of migraines.  Patient does have a history of chronic back pain.     Past Medical History:  Diagnosis Date  . Chronic back pain     There are no active problems to display for this patient.   Past Surgical History:  Procedure Laterality Date  . BACK SURGERY    . CHOLECYSTECTOMY    . TONSILLECTOMY       OB History    Gravida      Para      Term      Preterm      AB      Living  3     SAB      TAB      Ectopic      Multiple      Live Births               Home Medications    Prior to Admission medications   Medication Sig Start Date End Date Taking? Authorizing Provider  oxyCODONE-acetaminophen (PERCOCET/ROXICET) 5-325 MG tablet Take 1 tablet by mouth every 4 (four) hours as needed. 08/17/17   Raeford Razor, MD    Family History History reviewed. No pertinent family history.  Social History Social History   Tobacco Use  . Smoking status: Current Every Day Smoker    Packs/day: 1.00    Types: Cigarettes  . Smokeless tobacco: Never Used  Substance Use Topics  . Alcohol use: No  . Drug use: No     Allergies   Patient has no known allergies.   Review of Systems Review of Systems  Constitutional: Negative for fever.  HENT: Negative for congestion, sinus pressure and sinus pain.   Eyes: Positive for visual disturbance. Negative for redness.  Respiratory: Negative for shortness of breath.   Cardiovascular: Negative for chest pain.    Gastrointestinal: Positive for nausea. Negative for abdominal pain and vomiting.  Genitourinary: Negative for dysuria.  Musculoskeletal: Negative for myalgias.  Skin: Negative for rash.  Neurological: Negative for dizziness, seizures, syncope, facial asymmetry, speech difficulty, weakness, numbness and headaches.  Hematological: Does not bruise/bleed easily.  Psychiatric/Behavioral: Negative for confusion.     Physical Exam Updated Vital Signs BP (!) 144/87 (BP Location: Right Arm)   Pulse 89   Temp 98.2 F (36.8 C) (Oral)   Resp 17   Ht 1.575 m (5\' 2" )   Wt 72.6 kg   LMP 02/14/2012   SpO2 96%   BMI 29.26 kg/m   Physical Exam  Constitutional: She is oriented to person, place, and time. She appears well-developed and well-nourished. No distress.  HENT:  Head: Normocephalic and atraumatic.  Eyes: Pupils are equal, round, and reactive to light. Conjunctivae and EOM are normal.  Neck: Neck supple.  Cardiovascular: Normal rate, regular rhythm and normal heart sounds.  Pulmonary/Chest: Effort normal and breath sounds normal. No respiratory distress.  Abdominal: Soft. Bowel sounds are normal. There is no tenderness.  Musculoskeletal:  Normal range of motion. She exhibits no edema.  Neurological: She is alert and oriented to person, place, and time. No cranial nerve deficit or sensory deficit. She exhibits normal muscle tone. Coordination normal.  Skin: Skin is warm. Capillary refill takes less than 2 seconds. No rash noted.  Nursing note and vitals reviewed.    ED Treatments / Results  Labs (all labs ordered are listed, but only abnormal results are displayed) Labs Reviewed  BASIC METABOLIC PANEL - Abnormal; Notable for the following components:      Result Value   Potassium 3.3 (*)    Calcium 8.8 (*)    All other components within normal limits  CBC - Abnormal; Notable for the following components:   WBC 11.4 (*)    RBC 5.46 (*)    Hemoglobin 15.7 (*)    HCT 48.1 (*)     All other components within normal limits    EKG None  Radiology Ct Head Wo Contrast  Result Date: 01/14/2018 CLINICAL DATA:  Pt c/o headache x 2 days, reports she has taken Ibuprofen at home with no relief but has not taken anything today Headache, acute, severe, worst HA of life per patientHeadache, acute, severe, worst HA of life EXAM: CT HEAD WITHOUT CONTRAST TECHNIQUE: Contiguous axial images were obtained from the base of the skull through the vertex without intravenous contrast. COMPARISON:  08/17/2017 FINDINGS: Brain: No acute intracranial hemorrhage. No focal mass lesion. No CT evidence of acute infarction. No midline shift or mass effect. No hydrocephalus. Basilar cisterns are patent. Vascular: No hyperdense vessel or unexpected calcification. Skull: Normal. Negative for fracture or focal lesion. Sinuses/Orbits: Paranasal sinuses and mastoid air cells are clear. Orbits are clear. Other: None. IMPRESSION: Normal head CT Electronically Signed   By: Genevive BiStewart  Edmunds M.D.   On: 01/14/2018 22:34    Procedures Procedures (including critical care time)  Medications Ordered in ED Medications  sodium chloride 0.9 % bolus 1,000 mL (0 mLs Intravenous Stopped 01/14/18 2235)  dexamethasone (DECADRON) injection 10 mg (10 mg Intravenous Given 01/14/18 2055)  diphenhydrAMINE (BENADRYL) injection 25 mg (25 mg Intravenous Given 01/14/18 2055)  metoCLOPramide (REGLAN) injection 10 mg (10 mg Intravenous Given 01/14/18 2055)     Initial Impression / Assessment and Plan / ED Course  I have reviewed the triage vital signs and the nursing notes.  Pertinent labs & imaging results that were available during my care of the patient were reviewed by me and considered in my medical decision making (see chart for details).    Head CT negative for any acute findings.  Patient had no significant neuro deficits.  Patient's headaches improved with migraine cocktail of Decadron Benadryl and Reglan.  Patient  without a history of chronic headaches or migraines.  We will have patient follow-up with neurology as an outpatient.  Patient will return for any new or worse symptoms.  Patient will go home and rest.  Work note provided.   Final Clinical Impressions(s) / ED Diagnoses   Final diagnoses:  Headache disorder    ED Discharge Orders    None       Vanetta MuldersZackowski, Audrionna Lampton, MD 01/14/18 2317

## 2018-01-14 NOTE — ED Triage Notes (Signed)
Pt c/o headache x 2 days, reports she has taken Ibuprofen at home with no relief but has not taken anything today

## 2018-01-14 NOTE — Discharge Instructions (Signed)
Head CT negative.  Follow-up with neurology.  Rest for the next 24 hours.  Work note provided.  Return for any new or worse symptoms.  Labs without significant abnormalities.

## 2018-01-14 NOTE — ED Notes (Signed)
Patient sleeping at this time.

## 2018-01-15 NOTE — ED Notes (Signed)
Patient still sleeping at this time.  

## 2019-06-19 ENCOUNTER — Other Ambulatory Visit: Payer: Self-pay

## 2019-06-19 ENCOUNTER — Ambulatory Visit: Payer: Medicaid Other | Attending: Internal Medicine

## 2019-06-19 DIAGNOSIS — Z20822 Contact with and (suspected) exposure to covid-19: Secondary | ICD-10-CM

## 2019-06-20 LAB — NOVEL CORONAVIRUS, NAA: SARS-CoV-2, NAA: NOT DETECTED

## 2019-06-20 LAB — SARS-COV-2, NAA 2 DAY TAT

## 2020-03-09 IMAGING — CT CT HEAD W/O CM
3 series · 15 of 47 positions shown, 18 images · non-contrast
Comparison: 08/17/2017

CLINICAL DATA: Pt c/o headache x 2 days, reports she has taken
Ibuprofen at home with no relief but has not taken anything today
Headache, acute, severe, worst HA of life per patientHeadache,
acute, severe, worst HA of life

EXAM:
CT HEAD WITHOUT CONTRAST
TECHNIQUE: Contiguous axial images were obtained from the base of the skull
through the vertex without intravenous contrast.

[Series 2: head wo · axial · 0.46mm/px · z∈[-126,-1]mm · 9 of 30 slices shown, 12 images]
[im 3/30  brain]
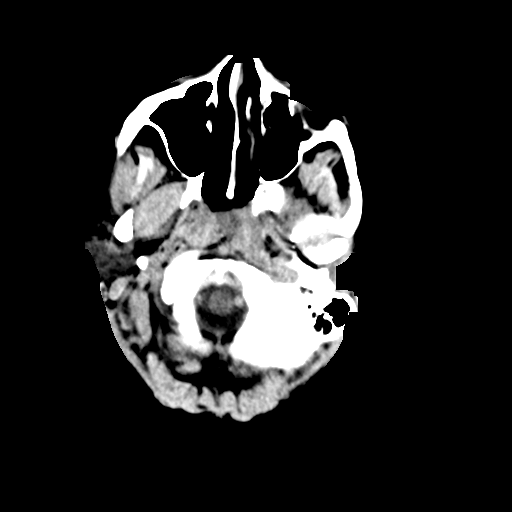
[im 3/30  bone]
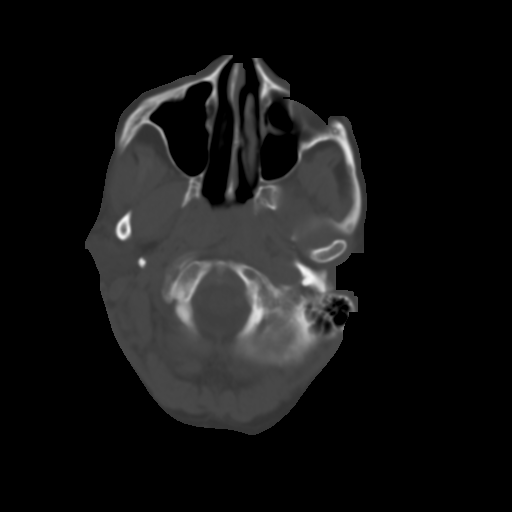
[im 6/30  brain]
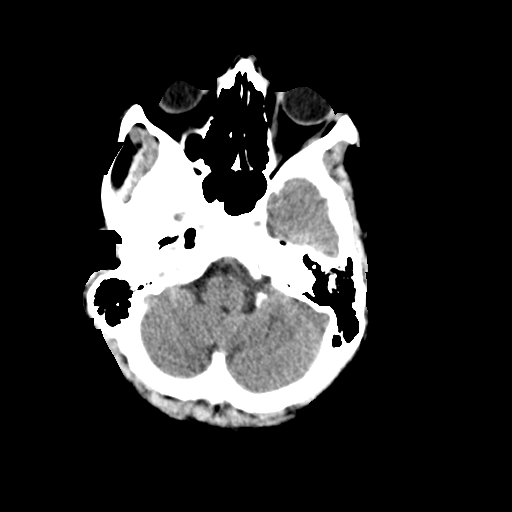
[im 9/30  brain]
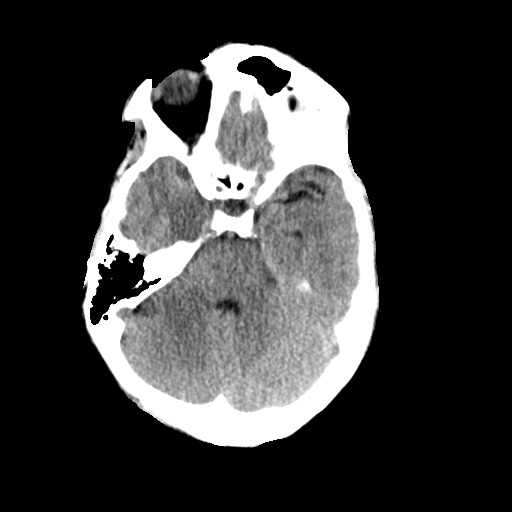
[im 12/30  brain]
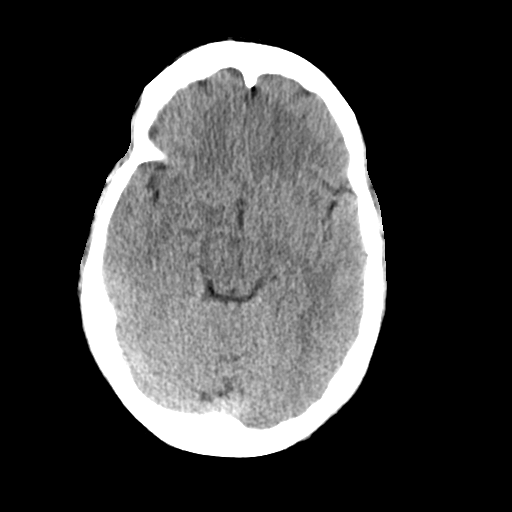
[im 16/30  brain]
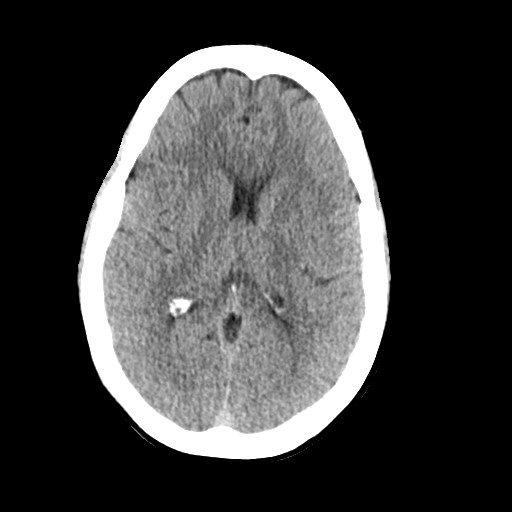
[im 16/30  bone]
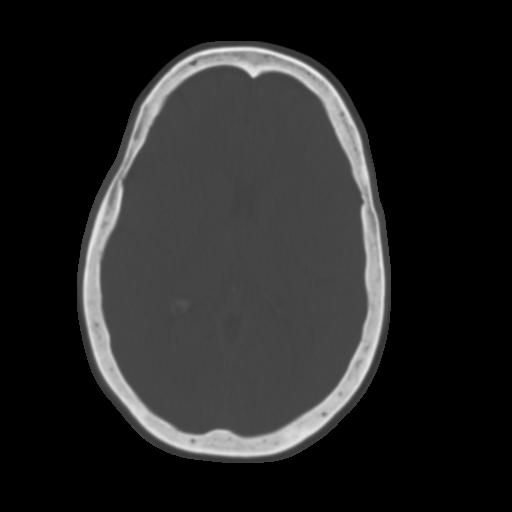
[im 19/30  brain]
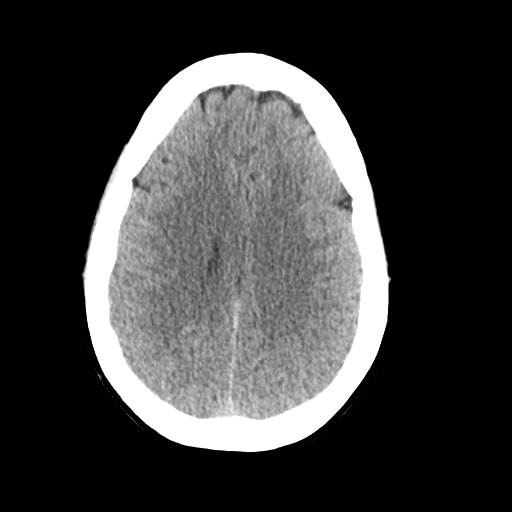
[im 22/30  brain]
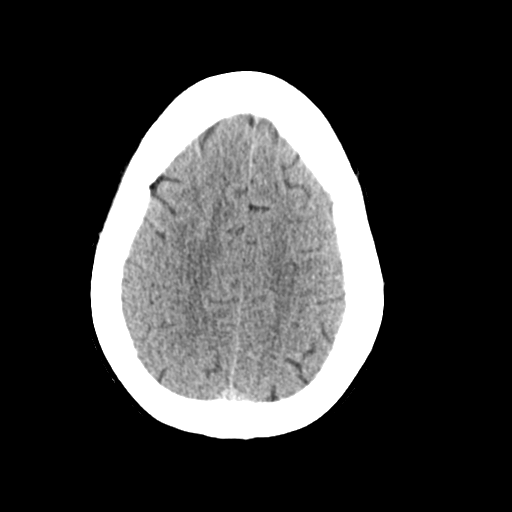
[im 25/30  brain]
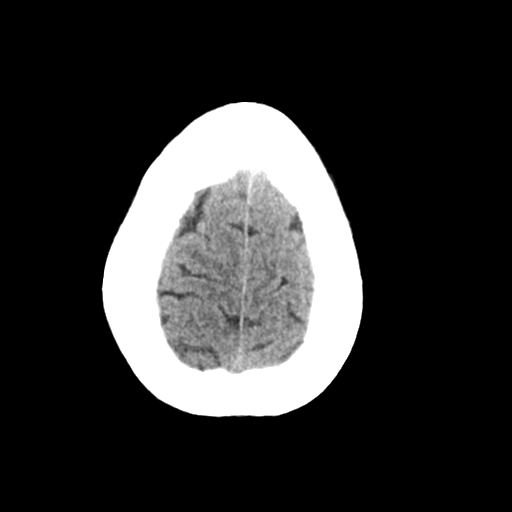
[im 28/30  brain]
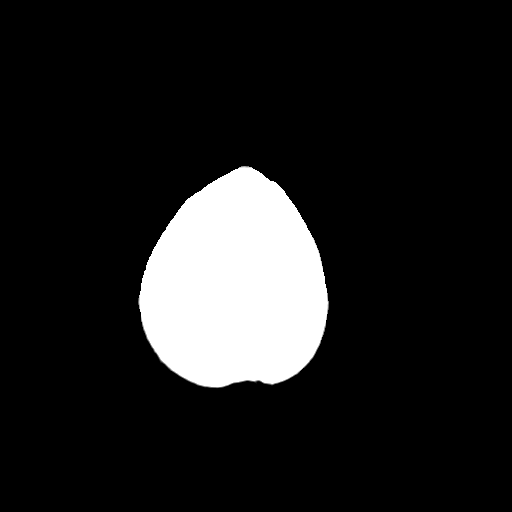
[im 28/30  bone]
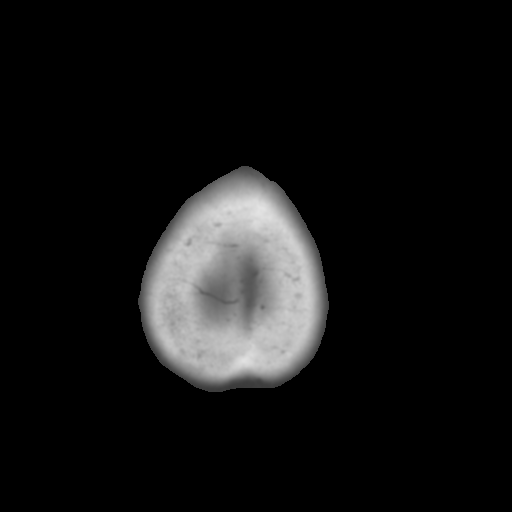

[Series 4: coronal soft tissue · coronal · 0.33mm/px · 3 of 75 slices shown]
[im 25/75  brain]
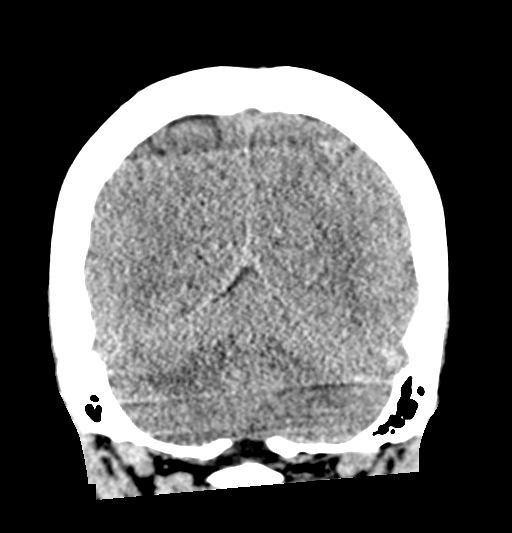
[im 33/75  brain]
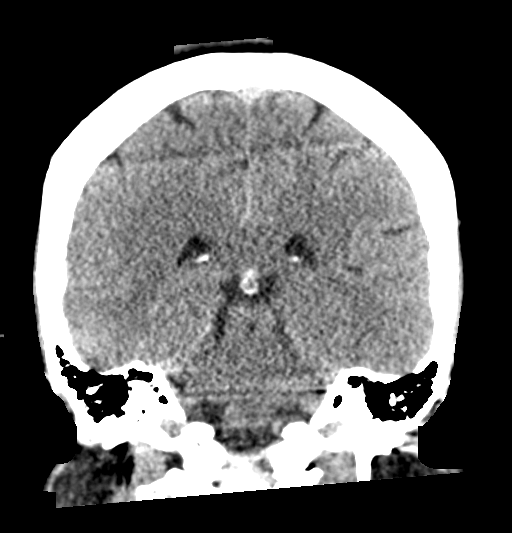
[im 42/75  brain]
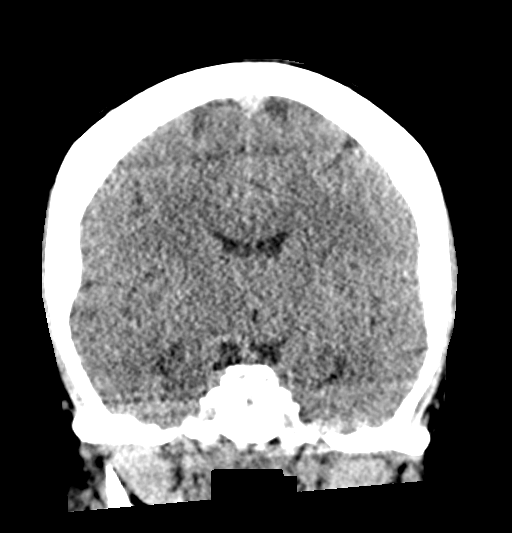

[Series 5: sagittal soft tissue · sagittal · 0.34mm/px · 3 of 67 slices shown]
[im 23/67  brain]
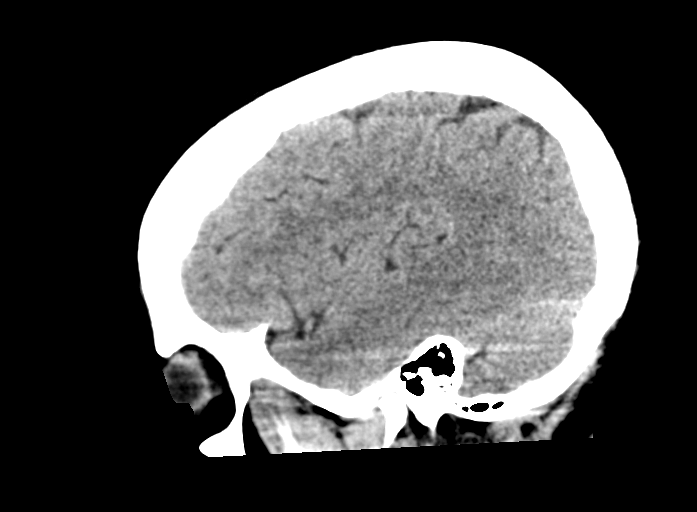
[im 34/67  brain]
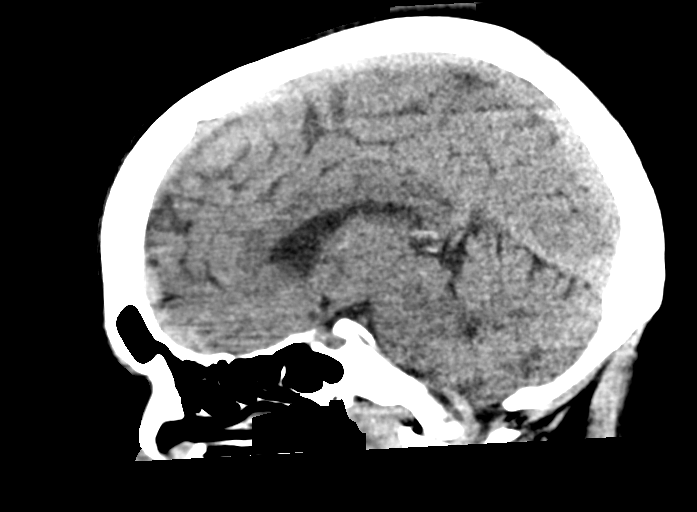
[im 45/67  brain]
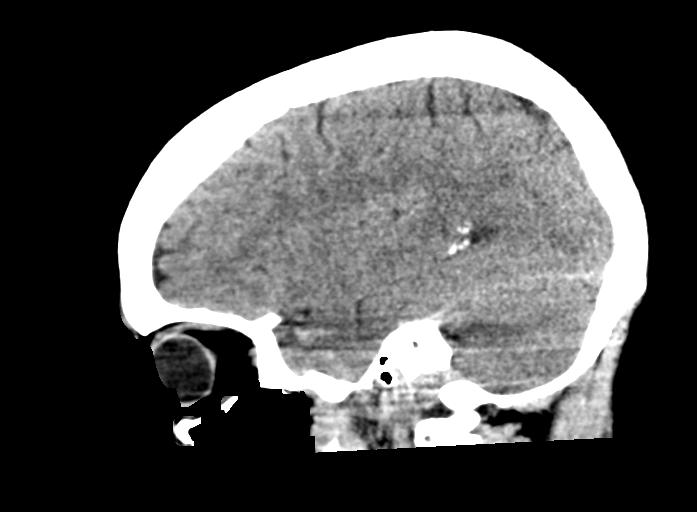

[15 of 47 positions shown; findings below may reference images not displayed]

FINDINGS: Brain: No acute intracranial hemorrhage. No focal mass lesion. No CT
evidence of acute infarction. No midline shift or mass effect. No
hydrocephalus. Basilar cisterns are patent.

Vascular: No hyperdense vessel or unexpected calcification.

Skull: Normal. Negative for fracture or focal lesion.

Sinuses/Orbits: Paranasal sinuses and mastoid air cells are clear.
Orbits are clear.

Other: None.
IMPRESSION: Normal head CT

## 2021-04-13 ENCOUNTER — Institutional Professional Consult (permissible substitution): Payer: Medicaid Other | Admitting: Internal Medicine

## 2021-04-30 ENCOUNTER — Institutional Professional Consult (permissible substitution): Payer: Medicaid Other | Admitting: Adult Health

## 2021-07-15 ENCOUNTER — Institutional Professional Consult (permissible substitution): Payer: Medicaid Other | Admitting: Pulmonary Disease

## 2022-09-28 ENCOUNTER — Ambulatory Visit
Admission: EM | Admit: 2022-09-28 | Discharge: 2022-09-28 | Disposition: A | Discharge status: Home / Self Care | Payer: Medicaid Other

## 2022-09-28 DIAGNOSIS — K047 Periapical abscess without sinus: Secondary | ICD-10-CM | POA: Diagnosis not present

## 2022-09-28 MED ORDER — AMOXICILLIN-POT CLAVULANATE 875-125 MG PO TABS: 1.0000 | ORAL_TABLET | Freq: Two times a day (BID) | ORAL | 0 refills | Status: AC

## 2022-09-28 MED ORDER — LIDOCAINE VISCOUS HCL 2 % MT SOLN: 10.0000 mL | OROMUCOSAL | 0 refills | Status: AC | PRN

## 2022-09-28 MED ORDER — CHLORHEXIDINE GLUCONATE 0.12 % MT SOLN: 15.0000 mL | Freq: Two times a day (BID) | OROMUCOSAL | 0 refills | Status: AC

## 2022-09-28 NOTE — ED Triage Notes (Signed)
Pt c/o right side facial swelling and tooth pain x 3 days.

## 2022-09-28 NOTE — ED Provider Notes (Signed)
[facility]    CSN: [id number] Arrival date & time: 09/28/22  1701      History   Chief Complaint No chief complaint on file.   HPI Christy Hopkins is a 57 y.o. female.   Patient presenting today with 3-day history of right upper dental pain where a tooth has broken off.  Now with significant right-sided facial swelling additionally and low-grade fever.  Denies drainage, difficulty breathing or swallowing, body aches, nausea, vomiting.  Trying Tylenol with no relief.    Past Medical History:  Diagnosis Date   Chronic back pain     There are no problems to display for this patient.   Past Surgical History:  Procedure Laterality Date   BACK SURGERY     CHOLECYSTECTOMY     TONSILLECTOMY      OB History     Gravida      Para      Term      Preterm      AB      Living  3      SAB      IAB      Ectopic      Multiple      Live Births               Home Medications    Prior to Admission medications   Medication Sig Start Date End Date Taking? Authorizing Provider  amoxicillin-clavulanate (AUGMENTIN) 875-125 MG tablet Take 1 tablet by mouth every 12 (twelve) hours. 09/28/22  Yes Particia Nearing, PA-C  chlorhexidine (PERIDEX) 0.12 % solution Use as directed 15 mLs in the mouth or throat 2 (two) times daily. 09/28/22  Yes Particia Nearing, PA-C  lidocaine (XYLOCAINE) 2 % solution Use as directed 10 mLs in the mouth or throat every 3 (three) hours as needed. 09/28/22  Yes Particia Nearing, PA-C  lisinopril (ZESTRIL) 10 MG tablet Take 10 mg by mouth daily. 07/23/22  Yes [provider]  tiZANidine (ZANAFLEX) 4 MG tablet Take 4 mg by mouth 2 (two) times daily as needed. 06/11/22  Yes [provider]  oxyCODONE-acetaminophen (PERCOCET/ROXICET) 5-325 MG tablet Take 1 tablet by mouth every 4 (four) hours as needed. 08/17/17   Raeford Razor, MD    Family History History reviewed. No pertinent family  history.  Social History Social History   Tobacco Use   Smoking status: Every Day    Current packs/day: 1.00    Types: Cigarettes   Smokeless tobacco: Never  Vaping Use   Vaping status: Never Used  Substance Use Topics   Alcohol use: No   Drug use: No     Allergies   Patient has no known allergies.   Review of Systems Review of Systems Per HPI  Physical Exam Triage Vital Signs ED Triage Vitals [09/28/22 1802]  Encounter Vitals Group     BP (!) 150/85     Systolic BP Percentile      Diastolic BP Percentile      Pulse Rate 77     Resp 13     Temp 99.6 F (37.6 C)     Temp Source Oral     SpO2      Weight      Height      Head Circumference      Peak Flow      Pain Score 10     Pain Loc      Pain Education  Exclude from Growth Chart    No data found.  Updated Vital Signs BP (!) 150/85 (BP Location: Right Arm)   Pulse 77   Temp 99.6 F (37.6 C) (Oral)   Resp 13   LMP 02/14/2012   Visual Acuity Right Eye Distance:   Left Eye Distance:   Bilateral Distance:    Right Eye Near:   Left Eye Near:    Bilateral Near:     Physical Exam Vitals and nursing note reviewed.  Constitutional:      Appearance: Normal appearance. She is not ill-appearing.  HENT:     Head: Atraumatic.     Mouth/Throat:     Mouth: Mucous membranes are moist.     Pharynx: Oropharynx is clear.     Comments: Decaying broken off molar to the right upper jaw with significant surrounding gingival erythema, edema.  No active drainage or bleeding.  Oral airway patent Eyes:     Extraocular Movements: Extraocular movements intact.     Conjunctiva/sclera: Conjunctivae normal.  Cardiovascular:     Rate and Rhythm: Normal rate and regular rhythm.     Heart sounds: Normal heart sounds.  Pulmonary:     Effort: Pulmonary effort is normal.     Breath sounds: Normal breath sounds.  Musculoskeletal:        General: Normal range of motion.     Cervical back: Normal range of motion and  neck supple.  Skin:    General: Skin is warm and dry.  Neurological:     Mental Status: She is alert and oriented to person, place, and time.  Psychiatric:        Mood and Affect: Mood normal.        Thought Content: Thought content normal.        Judgment: Judgment normal.      UC Treatments / Results  Labs (all labs ordered are listed, but only abnormal results are displayed) Labs Reviewed - No data to display  EKG   Radiology No results found.  Procedures Procedures (including critical care time)  Medications Ordered in UC Medications - No data to display  Initial Impression / Assessment and Plan / UC Course  I have reviewed the triage vital signs and the nursing notes.  Pertinent labs & imaging results that were available during my care of the patient were reviewed by me and considered in my medical decision making (see chart for details).     Treat with Augmentin, Peridex, viscous lidocaine, over-the-counter pain relievers.  Close dental follow-up recommended.  Return for worsening symptoms.  Final Clinical Impressions(s) / UC Diagnoses   Final diagnoses:  Dental infection   Discharge Instructions   None    ED Prescriptions     Medication Sig Dispense Auth. Provider   amoxicillin-clavulanate (AUGMENTIN) 875-125 MG tablet Take 1 tablet by mouth every 12 (twelve) hours. 14 tablet Particia Nearing, [place]   lidocaine (XYLOCAINE) 2 % solution Use as directed 10 mLs in the mouth or throat every 3 (three) hours as needed. 100 mL Particia Nearing, PA-C   chlorhexidine (PERIDEX) 0.12 % solution Use as directed 15 mLs in the mouth or throat 2 (two) times daily. 120 mL Particia Nearing, [place]      PDMP not reviewed this encounter.   Particia Nearing, [place] 09/28/22 1810
